# Patient Record
Sex: Male | Born: 1965 | Race: Black or African American | Hispanic: No | Marital: Single | Smoking: Current every day smoker
Health system: Southern US, Community
[De-identification: ages and names within clinical notes are randomized; demographics above are authoritative.]

## PROBLEM LIST (undated history)

## (undated) DIAGNOSIS — K7460 Unspecified cirrhosis of liver: Secondary | ICD-10-CM

## (undated) DIAGNOSIS — B182 Chronic viral hepatitis C: Secondary | ICD-10-CM

## (undated) DIAGNOSIS — F1020 Alcohol dependence, uncomplicated: Secondary | ICD-10-CM

## (undated) DIAGNOSIS — I10 Essential (primary) hypertension: Secondary | ICD-10-CM

## (undated) HISTORY — DX: Chronic viral hepatitis C: B18.2

## (undated) HISTORY — DX: Unspecified cirrhosis of liver: K74.60

## (undated) HISTORY — DX: Essential (primary) hypertension: I10

---

## 2008-05-06 ENCOUNTER — Emergency Department: Payer: Self-pay | Admitting: Emergency Medicine

## 2011-12-22 ENCOUNTER — Emergency Department: Payer: Self-pay | Admitting: Emergency Medicine

## 2013-03-24 ENCOUNTER — Encounter (HOSPITAL_COMMUNITY): Payer: Self-pay | Admitting: Emergency Medicine

## 2013-03-24 ENCOUNTER — Emergency Department (HOSPITAL_COMMUNITY): Payer: Self-pay

## 2013-03-24 ENCOUNTER — Emergency Department (HOSPITAL_COMMUNITY)
Admission: EM | Admit: 2013-03-24 | Discharge: 2013-03-24 | Disposition: A | Discharge status: Home / Self Care | Payer: Self-pay | Attending: Emergency Medicine | Admitting: Emergency Medicine

## 2013-03-24 DIAGNOSIS — J3489 Other specified disorders of nose and nasal sinuses: Secondary | ICD-10-CM | POA: Insufficient documentation

## 2013-03-24 DIAGNOSIS — R197 Diarrhea, unspecified: Secondary | ICD-10-CM | POA: Insufficient documentation

## 2013-03-24 DIAGNOSIS — J40 Bronchitis, not specified as acute or chronic: Secondary | ICD-10-CM

## 2013-03-24 DIAGNOSIS — R630 Anorexia: Secondary | ICD-10-CM | POA: Insufficient documentation

## 2013-03-24 DIAGNOSIS — B9789 Other viral agents as the cause of diseases classified elsewhere: Secondary | ICD-10-CM | POA: Insufficient documentation

## 2013-03-24 DIAGNOSIS — IMO0001 Reserved for inherently not codable concepts without codable children: Secondary | ICD-10-CM | POA: Insufficient documentation

## 2013-03-24 DIAGNOSIS — B349 Viral infection, unspecified: Secondary | ICD-10-CM

## 2013-03-24 DIAGNOSIS — F17200 Nicotine dependence, unspecified, uncomplicated: Secondary | ICD-10-CM | POA: Insufficient documentation

## 2013-03-24 DIAGNOSIS — J209 Acute bronchitis, unspecified: Secondary | ICD-10-CM | POA: Insufficient documentation

## 2013-03-24 MED ORDER — AZITHROMYCIN 250 MG PO TABS: 250.0000 mg | ORAL_TABLET | Freq: Every day | ORAL | Status: DC

## 2013-03-24 MED ORDER — ALBUTEROL SULFATE HFA 108 (90 BASE) MCG/ACT IN AERS
2.0000 | INHALATION_SPRAY | Freq: Once | RESPIRATORY_TRACT | Status: AC
  Administered 2013-03-24: 2 via RESPIRATORY_TRACT
  Filled 2013-03-24: qty 6.7

## 2013-03-24 MED ORDER — ACETAMINOPHEN 325 MG PO TABS
650.0000 mg | ORAL_TABLET | Freq: Once | ORAL | Status: AC
  Administered 2013-03-24: 650 mg via ORAL
  Filled 2013-03-24: qty 2

## 2013-03-24 NOTE — ED Notes (Signed)
Pt c/o "flu lilke symptoms" x 3 days.  C/o cough, chills, generalized body aches, and runny nose.  Pain score 8/10.  Pt sts his aunt has been sick too.

## 2013-03-24 NOTE — ED Provider Notes (Signed)
CSN: [id number]     Arrival date & time 03/24/13  1459 History   This chart was scribed for non-physician practitioner Raymon MuttonMarissa Jonthan Leite, PA-C, working with Toy BakerAnthony T Allen, MD, by Yevette EdwardsAngela Bracken, ED Scribe. This patient was seen in room WTR5/WTR5 and the patient's care was started at 4:19 PM. First MD Initiated Contact with Patient 03/24/13 1540     Chief Complaint  Patient presents with  . Flu like symptoms     The history is provided by the patient. No language interpreter was used.   HPI Comments: Danny Carlson is a 48 y.o. male who presents to the Emergency Department complaining of three days of myalgia which has been accompanied by a cough productive of white phlegm, posttussis chest tightness, chills, white-colored rhinorrhea, decreased appetite, a sore throat, and diarrhea . He rates the myalgia as 8/10. In the ED, his temperature is 99.2 F. He denies experiencing SOB, chest pain, neck pain, neck stiffness, otalgia, eye pain, abdominal pain, nausea, emesis, sinus pressure, blurred vision, dizziness, or a fever. He has treated his symptoms with Robitussin and Alka-Selzer Plus with temporary relief. The pt has been exposed to sick contacts with similar symptoms. He is a current pack-a- day smoker.   The pt does not have a PCP.   History reviewed. No pertinent past medical history. History reviewed. No pertinent past surgical history. History reviewed. No pertinent family history. History  Substance Use Topics  . Smoking status: Current Every Day Smoker -- 0.50 packs/day    Types: Cigarettes  . Smokeless tobacco: Never Used  . Alcohol Use: Yes    Review of Systems  Constitutional: Positive for chills. Negative for fever (99.2 F in the ED).  HENT: Positive for rhinorrhea and sore throat. Negative for ear pain and sinus pressure.   Eyes: Negative for pain and visual disturbance.  Respiratory: Positive for cough. Negative for shortness of breath.   Cardiovascular: Negative for  chest pain.  Gastrointestinal: Positive for diarrhea. Negative for nausea, vomiting and abdominal pain.  Musculoskeletal: Positive for myalgias. Negative for neck pain and neck stiffness.  Neurological: Negative for dizziness.  All other systems reviewed and are negative.    Allergies  Review of patient's allergies indicates no known allergies.  Home Medications   Current Outpatient Rx  Name  Route  Sig  Dispense  Refill  . Acetaminophen-Guaifenesin (THERAFLU FLU/CHEST CONGESTION) 1000-400 MG PACK   Oral   Take 1 Package by mouth 2 (two) times daily as needed (flu-like symptoms).         Marland Kitchen. azithromycin (ZITHROMAX) 250 MG tablet   Oral   Take 1 tablet (250 mg total) by mouth daily. Take first 2 tablets together, then 1 every day until finished.   6 tablet   0   . guaiFENesin (ROBITUSSIN) 100 MG/5ML SOLN   Oral   Take 15 mLs by mouth every 4 (four) hours as needed for cough or to loosen phlegm (cough).         . pseudoephedrine (SINUS & ALLERGY 12 HOUR) 120 MG 12 hr tablet   Oral   Take 120 mg by mouth every 12 (twelve) hours as needed for congestion (congestion).          Triage Vitals: BP 131/85  Pulse 85  Temp(Src) 99.2 F (37.3 C) (Oral)  Resp 20  SpO2 98%  Physical Exam  Nursing note and vitals reviewed. Constitutional: He is oriented to person, place, and time. He appears well-developed and well-nourished. No distress.  HENT:  Head: Normocephalic and atraumatic.  Right Ear: Tympanic membrane and external ear normal. Tympanic membrane is not injected, not erythematous, not retracted and not bulging. No middle ear effusion.  Left Ear: Tympanic membrane and external ear normal. Tympanic membrane is not injected, not erythematous, not retracted and not bulging.  No middle ear effusion.  Mouth/Throat: Oropharynx is clear and moist. No oropharyngeal exudate.  Negative swelling, erythema, inflammation, lesions, sores, exudate, petechiae noted to posterior  oropharynx and bilateral tonsils. Uvula midline, symmetrical elevation. Negative uvula swelling. Negative trismus.  Eyes: Conjunctivae and EOM are normal. Pupils are equal, round, and reactive to light. Right eye exhibits no discharge. Left eye exhibits no discharge.  Neck: Normal range of motion. Neck supple. No tracheal deviation present.  Neck stiffness Negative nuchal rigidity Negative cervical lymphadenopathy  Cardiovascular: Normal rate, regular rhythm and normal heart sounds.  Exam reveals no friction rub.   No murmur heard. Pulses:      Radial pulses are 2+ on the right side, and 2+ on the left side.  Cap refill less than 3 seconds  Pulmonary/Chest: Effort normal and breath sounds normal. No respiratory distress. He has no wheezes. He has no rales. He exhibits no tenderness.  Negative respiratory distress Patient able to speak in full sentences without difficulty Negative rhonchi or wheezes identified upon auscultation  Musculoskeletal: Normal range of motion.  Lymphadenopathy:    He has no cervical adenopathy.  Neurological: He is alert and oriented to person, place, and time. No cranial nerve deficit. He exhibits normal muscle tone. Coordination normal.  Skin: Skin is warm and dry.  Psychiatric: He has a normal mood and affect. His behavior is normal.     ED Course  Procedures (including critical care time)  DIAGNOSTIC STUDIES: Oxygen Saturation is 98% on room air, normal by my interpretation.    COORDINATION OF CARE:  4:24 PM- Discussed treatment plan with patient, which includes a chest x-ray and EKG, and the patient agreed to the plan.   Labs Review Labs Reviewed  INFLUENZA PANEL BY PCR (TYPE A & B, H1N1)   Imaging Review Dg Chest 2 View  03/24/2013   CLINICAL DATA:  Flu-like symptoms.  Cough and chest congestion.  EXAM: CHEST  2 VIEW  COMPARISON:  None.  FINDINGS: The heart size is normal. The lungs are clear. Degenerative or posttraumatic changes are noted in  the left shoulder at the [facility] joint and coracoclavicular ligament. The visualized soft tissues and bony thorax are otherwise unremarkable.  IMPRESSION: 1. No acute cardiopulmonary disease. 2. Degenerative or likely posttraumatic changes at the left shoulder.   Electronically Signed   By: Gennette Pac M.D.   On: 03/24/2013 15:53    EKG Interpretation    Date/Time:  Thursday March 24 2013 16:32:26 EST Ventricular Rate:  83 PR Interval:  149 QRS Duration: 72 QT Interval:  372 QTC Calculation: 437 R Axis:   45 Text Interpretation:  Sinus rhythm Right atrial enlargement ST elev, probable normal early repol pattern Baseline wander in lead(s) V3 V6 Confirmed by ALLEN  MD, ANTHONY ([id number]) on 03/24/2013 4:36:08 PM            MDM   1. Viral illness   2. Bronchitis    Medications  acetaminophen (TYLENOL) tablet 650 mg (not administered)   Filed Vitals:   03/24/13 1509  BP: 131/85  Pulse: 85  Temp: 99.2 F (37.3 C)  TempSrc: Oral  Resp: 20  SpO2: 98%   I personally  performed the services described in this documentation, which was scribed in my presence. The recorded information has been reviewed and is accurate.  Patient presenting to emergency department with bodyaches that his generalized, cough that is productive, sore throat, nasal congestion, chest tightness associated with cough is been ongoing for the past 3 days. Patient reports that his aunt has the same symptoms. Reports he's been using Alka-Seltzer, Theraflu, Robitussin with minimal relief. Patient reports she smokes cigarettes proximal and one pack per day. Alert oriented. GCS 15. Heart rate and rhythm normal. Lungs clear to auscultation-mildly decreased breath sounds. Good lung expansion. Negative signs of respiratory distress-patient stable to speak in full sentences without difficulty. Negative use of excess her muscles. Radial pulses 2+ bilaterally. Nasal congestion identified. Negative swelling, erythema, exudate,  petechiae noted to bilateral tonsils and posterior oropharynx. Uvula midline, symmetrical elevation. Negative uvula swelling. Full range of motion to upper and lower extremities identified without difficulty. Cap refill less than 3 seconds. Chest x-ray negative for acute cardiopulmonary disease-negative findings for acute reactive airway disease. EKG negative findings for acute ischemic findings. Influenza panel pending. Patient stable, afebrile. Doubt pneumonia. Suspicion to be bronchitis-viral illness in nature. Patient given albuterol inhaler and discharged with azithromycin. Referred patient to urgent care Center and health and wellness Center to be reevaluated. Discussed with patient to rest and stay hydrated. Patient to refrain from cigarette smoking. Discussed with patient to closely monitor symptoms and if symptoms are to worsen or change to report back to the ED - strict return instructions given.  Patient agreed to plan of care, understood, all questions answered.   Raymon Mutton, PA-C 03/25/13 1907

## 2013-03-24 NOTE — Discharge Instructions (Signed)
Please call and set an appointment with urgent care Center to be reassessed regarding upper respiratory infection, bronchitis Please use albuterol inhaler as needed for shortness of breath, to help breathing Please refrain from any cigarette smoking for this can worsen symptoms in nature and can lead to worse infection Please continue to monitor for fever-please continue to use Tylenol as needed for fever control-please no more than 4000 milligram/4 gram per day for this can lead to liver failure and Tylenol overdose Please take antibiotics as prescribed-please take on a full stomach  Please continue to monitor symptoms closely and if symptoms are to worsen or change (fever greater than 101, chills, neck pain, neck stiffness, shortness of breath, difficulty breathing, chest pain, weakness, numbness, tingling, worsening cough, sore throat, inability to swallow) please report back to emergency department immediately  Bronchitis Bronchitis is inflammation of the airways that extend from the windpipe into the lungs (bronchi). The inflammation often causes mucus to develop, which leads to a cough. If the inflammation becomes severe, it may cause shortness of breath. CAUSES  Bronchitis may be caused by:   Viral infections.   Bacteria.   Cigarette smoke.   Allergens, pollutants, and other irritants.  SIGNS AND SYMPTOMS  The most common symptom of bronchitis is a frequent cough that produces mucus. Other symptoms include:  Fever.   Body aches.   Chest congestion.   Chills.   Shortness of breath.   Sore throat.  DIAGNOSIS  Bronchitis is usually diagnosed through a medical history and physical exam. Tests, such as chest X-rays, are sometimes done to rule out other conditions.  TREATMENT  You may need to avoid contact with whatever caused the problem (smoking, for example). Medicines are sometimes needed. These may include:  Antibiotics. These may be prescribed if the condition  is caused by bacteria.  Cough suppressants. These may be prescribed for relief of cough symptoms.   Inhaled medicines. These may be prescribed to help open your airways and make it easier for you to breathe.   Steroid medicines. These may be prescribed for those with recurrent (chronic) bronchitis. HOME CARE INSTRUCTIONS  Get plenty of rest.   Drink enough fluids to keep your urine clear or pale yellow (unless you have a medical condition that requires fluid restriction). Increasing fluids may help thin your secretions and will prevent dehydration.   Only take over-the-counter or prescription medicines as directed by your health care provider.  Only take antibiotics as directed. Make sure you finish them even if you start to feel better.  Avoid secondhand smoke, irritating chemicals, and strong fumes. These will make bronchitis worse. If you are a smoker, quit smoking. Consider using nicotine gum or skin patches to help control withdrawal symptoms. Quitting smoking will help your lungs heal faster.   Put a cool-mist humidifier in your bedroom at night to moisten the air. This may help loosen mucus. Change the water in the humidifier daily. You can also run the hot water in your shower and sit in the bathroom with the door closed for 5 10 minutes.   Follow up with your health care provider as directed.   Wash your hands frequently to avoid catching bronchitis again or spreading an infection to others.  SEEK MEDICAL CARE IF: Your symptoms do not improve after 1 week of treatment.  SEEK IMMEDIATE MEDICAL CARE IF:  Your fever increases.  You have chills.   You have chest pain.   You have worsening shortness of breath.  You have bloody sputum.  You faint.  You have lightheadedness.  You have a severe headache.   You vomit repeatedly. MAKE SURE YOU:   Understand these instructions.  Will watch your condition.  Will get help right away if you are not doing  well or get worse. Document Released: 02/24/2005 Document Revised: 12/15/2012 Document Reviewed: 10/19/2012 [facility] Patient Information 2014 [place], [place].   Emergency Department Resource Guide 1) Find a Doctor and Pay Out of Pocket Although you won't have to find out who is covered by your insurance plan, it is a good idea to ask around and get recommendations. You will then need to call the office and see if the doctor you have chosen will accept you as a new patient and what types of options they offer for patients who are self-pay. Some doctors offer discounts or will set up payment plans for their patients who do not have insurance, but you will need to ask so you aren't surprised when you get to your appointment.  2) Contact Your Local Health Department Not all health departments have doctors that can see patients for sick visits, but many do, so it is worth a call to see if yours does. If you don't know where your local health department is, you can check in your phone book. The CDC also has a tool to help you locate your state's health department, and many state websites also have listings of all of their local health departments.  3) Find a Walk-in Clinic If your illness is not likely to be very severe or complicated, you may want to try a walk in clinic. These are popping up all over the country in pharmacies, drugstores, and shopping centers. They're usually staffed by nurse practitioners or physician assistants that have been trained to treat common illnesses and complaints. They're usually fairly quick and inexpensive. However, if you have serious medical issues or chronic medical problems, these are probably not your best option.  No Primary Care Doctor: - Call Health Connect at  [phone] - they can help you locate a primary care doctor that  accepts your insurance, provides certain services, etc. - Physician Referral Service- [phone]  Chronic Pain  Problems: Organization         Address  Phone   Notes  [facility]  [phone] Patients need to be referred by their primary care doctor.   Medication Assistance: Organization         Address  Phone   Notes  [facility] Medication [facility] [address] [place] [place], [place] [address] [phone] --Must be a resident of [facility] -- Must have NO insurance coverage whatsoever (no Medicaid/ Medicare, etc.) -- The pt. MUST have a primary care doctor that directs their care regularly and follows them in the community   [facility]  [phone]   [employer]  [phone]    Agencies that provide inexpensive medical care: Organization         Address  Phone   Notes  [facility]  [phone]   [facility]    [phone]   [facility] [address] [place], [place] [address] [phone]   [facility] [address], [place] [phone]   Planned Parenthood    [phone]   [facility]    [phone]   [facility]  [address], [place] Phone:  [phone], Fax:  [phone] Hours of Operation:  9 am - 6 pm, M-F.  Also accepts Medicaid/Medicare and self-pay.  [facility]  [address], [place] Phone: [phone], Fax: [phone]. Hours of Operation:  8:30 am - 5:30 pm, M-F.  Also accepts Medicaid and self-pay.  [facility] [place] [address], [place] Phone: [phone]   [facility] [address] Natasha Bence [place], [place] [phone], Ext. [phone] Mondays & Thursdays: 7-9 AM.  First 15 patients are seen on a first come, first serve basis.    Medicaid-accepting [facility] Providers:  Organization         Address  Phone   Notes  [facility] [address], [place] [phone] Also  accepts self-pay patients.  [facility] [address] Laurell Josephs [place], [place]  [phone]   [facility] [address], [place] [phone]   [facility] [address] [place] [phone]   Renaye Rakers [address], [place]   [phone] Only accepts [place] patients after they have their name applied to their card.   Self-Pay (no insurance) in [facility]:  Organization         Address  Phone   Notes  Sickle Cell Patients, [facility] [address] [place], [place] [phone]   [facility] [address] [place], [place] [phone]   [facility] [place]  [address], [place] [phone]   [facility]/Dr. Osei-Bonsu  [address] [place] or [address], [place] [phone] Phone number for both [place] and [place] locations is the same.  [facility]gent Medical and [facility] [address] [place] [phone]   [facility] [address], [place] or [address] [phone]   [facility] [address] [place] [phone], phone; [phone], fax Sees patients 1st and 3rd Saturday of every month.  Must not qualify for public or private insurance (i.e. Medicaid, Medicare, [place] Health Choice, Veterans' Benefits)  Household income should be no more than 200% of the poverty level The clinic cannot treat you if you are pregnant or think you are pregnant  Sexually transmitted diseases are not treated at the clinic.    Dental Care: Organization         Address  Phone  Notes  [facility] Department of [facility] [address] [place], [place] [phone] Accepts children up to age 32 who are enrolled in [place] or [place] Health Choice; pregnant  women with a Medicaid card; and children who have applied for Medicaid or [place] Health Choice, but were declined, whose parents can pay a reduced fee at time of service.  [facility] Department of [facility]  [address], [place] [phone] Accepts children up to age 29 who are enrolled in [place] or [place] Health Choice; pregnant women with a Medicaid card; and children who have applied for Medicaid or [place] Health Choice, but were declined, whose parents can pay a reduced fee at time of service.  [facility] Adult Dental Access PROGRAM  [address] [place], [place] [phone] Patients are  seen by appointment only. Walk-ins are not accepted. [facility] will see patients 59 years of age and older. Monday - Tuesday (8am-5pm) Most Wednesdays (8:30-5pm) $30 per visit, cash only  [facility] Adult Dental Access PROGRAM  [address], [facility] [phone] Patients are seen by appointment only. Walk-ins are not accepted. [facility] will see patients 62 years of age and older. One Wednesday Evening (Monthly: Volunteer Based).  $30 per visit, cash only  [employer]  [phone] for adults; Children under age 64, call Graduate Pediatric Dentistry at [phone]. Children aged 28-14, please call [phone] to request a pediatric application.  Dental services are provided in all areas of dental care including fillings, crowns and bridges, complete and partial dentures, implants, gum treatment, root canals, and extractions. Preventive care is also provided. Treatment is provided to both adults and children. Patients are selected via a lottery and there is often a waiting list.   [facility] [address], [place]  [phone] [email or web address]   [facility] [address] [place], [place] [phone], Ext. [phone] Second and Fourth Thursday of each month, opens at 6:30 AM; Clinic ends at 9 AM.  Patients are  seen on a first-come first-served basis, and a limited number are seen during each clinic.   [facility]  [address] Ether Griffins [place], [place] [phone]   Eligibility Requirements You must have lived in [place], [place], or [place] counties for at least the last three months.   You cannot be eligible for state or federal sponsored [place], including [employer], [place], or [employer].   You generally cannot be eligible for healthcare insurance through your employer.    How to apply: Eligibility screenings are held every Tuesday and Wednesday afternoon from 1:00 pm until 4:00 pm. You do not need an appointment for the interview!  [facility] [address], [place], [place] [phone]   [facility] Department  [phone]   [facility] Health Department  [phone]   [facility] Health Department  [phone]    Behavioral Health Resources in the Community: Intensive Outpatient Programs Organization         Address  Phone  Notes  [facility] [address] [place], [place] [phone]   [facility] Outpatient [address] [place], [place] [phone]   [facility] [address], [place], [place]  [phone]   [facility] [address],  [place], [place] [phone] or [phone]   Substance Abuse Resources Organization         Address  Phone  Notes  [facility] Drug [facility]  [phone]   [facility]  [phone]   [facility] [place]  [phone]   Floydene Flock  [phone]   Residential & Outpatient Substance Abuse Program  [phone]   Psychological Services Organization         Address  Phone  Notes  [facility]  [phone]   [facility]  [phone]   [facility] [address] [place] [phone] or [phone]    Mobile Crisis  Teams Organization         Address  Phone  Notes  [facility], Mobile Crisis Care Unit  [phone]   [facility]  [address] [place], [place] [phone]   [facility] [address] [place] [phone]    Self-Help/Support Groups Organization         Address  Phone             Notes  [facility] [place] - variety of support groups  336- [phone] Call for more information  Narcotics Anonymous (NA), [facility] [address], [place]  2 meetings at this location   Statisticianesidential Treatment Programs Organization         Address  Phone  Notes  [facility] [address],    [place]  [phone]   [facility]  [address], [place] [phone], [place], [place] [phone]   [facility] [address], [place] [phone] Admissions: 8am-3pm M-F  [facility] [address]    [place], [place] [phone]   [facility] [address], [place], [place] [phone]   [facility] [address]  [place], [place] [phone]   [facility] - Intensive Outpatient [address], [place], [place] [phone]   [facility] ([facility] [address]  [place], [place] [phone] or [phone]   [facility] ([facility]) [address] [place], [place] [phone] Accepts Medicaid  [facility] [address]  [place] [phone] Substance Abuse/Addiction Treatment   [facility] Behavioral Health Resources Organization         Address  Phone  Notes  [facility]  [phone]) [phone]   Angie FavaJulie Brannon, PhD [address] [place], [place]   [phone](336) [phone] or [phone]) [phone]   [facility]   [address] [place], [place] [phone]([phone]   [facility] [address], [place], [place] [phone](336) [phone]  Insurance/Medicaid/sponsorship through [facility]  [facility] [address]                                    [place], [place] [phone]) [phone] Therapy/tele-psych/case  [facility] [address]   [place], [place] [phone](336) [phone]    Dr. Lolly MustacheArfeen  [phone](336) [phone]   [facility] [place]  [employer] [facility] 1) [address], [place] 2) [address], [place] 3)  [address], [place] [phone](336) [phone]  [phone](336) [phone]   [facility] Child Abuse Hotline [phone](336) [phone] or [phone](336) [phone] (After Hours)

## 2013-03-25 LAB — INFLUENZA PANEL BY PCR (TYPE A & B)
H1N1 flu by pcr: NOT DETECTED
INFLAPCR: POSITIVE — AB
INFLBPCR: NEGATIVE

## 2013-03-28 NOTE — ED Provider Notes (Signed)
Medical screening examination/treatment/procedure(s) were performed by non-physician practitioner and as supervising physician I was immediately available for consultation/collaboration.   Amadea Keagy T Laurisa Sahakian, MD 03/28/13 0720 

## 2015-01-10 ENCOUNTER — Encounter (HOSPITAL_COMMUNITY): Payer: Self-pay | Admitting: *Deleted

## 2015-01-10 ENCOUNTER — Emergency Department (HOSPITAL_COMMUNITY): Payer: Self-pay

## 2015-01-10 ENCOUNTER — Emergency Department (HOSPITAL_COMMUNITY)
Admission: EM | Admit: 2015-01-10 | Discharge: 2015-01-10 | Disposition: A | Discharge status: Home / Self Care | Payer: Self-pay | Attending: Emergency Medicine | Admitting: Emergency Medicine

## 2015-01-10 DIAGNOSIS — Y9289 Other specified places as the place of occurrence of the external cause: Secondary | ICD-10-CM | POA: Insufficient documentation

## 2015-01-10 DIAGNOSIS — Y9389 Activity, other specified: Secondary | ICD-10-CM | POA: Insufficient documentation

## 2015-01-10 DIAGNOSIS — Y998 Other external cause status: Secondary | ICD-10-CM | POA: Insufficient documentation

## 2015-01-10 DIAGNOSIS — W08XXXA Fall from other furniture, initial encounter: Secondary | ICD-10-CM | POA: Insufficient documentation

## 2015-01-10 DIAGNOSIS — S0990XA Unspecified injury of head, initial encounter: Secondary | ICD-10-CM

## 2015-01-10 DIAGNOSIS — S01511A Laceration without foreign body of lip, initial encounter: Secondary | ICD-10-CM | POA: Insufficient documentation

## 2015-01-10 DIAGNOSIS — T1490XA Injury, unspecified, initial encounter: Secondary | ICD-10-CM

## 2015-01-10 DIAGNOSIS — R040 Epistaxis: Secondary | ICD-10-CM | POA: Insufficient documentation

## 2015-01-10 DIAGNOSIS — F10129 Alcohol abuse with intoxication, unspecified: Secondary | ICD-10-CM | POA: Insufficient documentation

## 2015-01-10 DIAGNOSIS — Z720 Tobacco use: Secondary | ICD-10-CM | POA: Insufficient documentation

## 2015-01-10 NOTE — ED Provider Notes (Signed)
CSN: [id number]     Arrival date & time 01/10/15  1520 History   First MD Initiated Contact with Patient 01/10/15 1749     Chief Complaint  Patient presents with  . Alcohol Intoxication  . Fall     (Consider location/radiation/quality/duration/timing/severity/associated sxs/prior Treatment) HPI Comments: 49 y.o. Male with history of previous head injury, alcohol abuse presents with his cousin for fall.  The patient reportedly has been drinking alcohol since last night and has had multiple 40 ounce beers today.  Last night he was with family and had a witnessed fall off of a couch and reportedly today the patient had another fall but he is not able to tell us what happened.  Apparently the people he was with were also drinking and could not tell much of a history but called EMS because he got hurt and had a bloody nose.  Patient is a 49 y.o. male presenting with intoxication and fall.  Alcohol Intoxication Pertinent negatives include no chest pain, no abdominal pain and no shortness of breath.  Fall Pertinent negatives include no chest pain, no abdominal pain and no shortness of breath.    History reviewed. No pertinent past medical history. History reviewed. No pertinent past surgical history. No family history on file. Social History  Substance Use Topics  . Smoking status: Current Every Day Smoker -- 0.50 packs/day    Types: Cigarettes  . Smokeless tobacco: Never Used  . Alcohol Use: Yes    Review of Systems  HENT: Positive for nosebleeds. Negative for dental problem, ear discharge, ear pain and sore throat.   Eyes: Negative for pain and visual disturbance.  Respiratory: Negative for chest tightness and shortness of breath.   Cardiovascular: Negative for chest pain.  Gastrointestinal: Negative for nausea, vomiting and abdominal pain.  Genitourinary: Negative for flank pain.  Musculoskeletal: Negative for myalgias, back pain, joint swelling and neck pain.  Skin: Positive for  wound (to bottom lip reportedly from yesterday).  Neurological: Negative for syncope.  Hematological: Does not bruise/bleed easily.      Allergies  Review of patient's allergies indicates no known allergies.  Home Medications   Prior to Admission medications   Medication Sig Start Date End Date Taking? Authorizing Provider  pseudoephedrine (SINUS & ALLERGY 12 HOUR) 120 MG 12 hr tablet Take 120 mg by mouth every 12 (twelve) hours as needed for congestion (congestion).   Yes Historical Provider, MD  azithromycin (ZITHROMAX) 250 MG tablet Take 1 tablet (250 mg total) by mouth daily. Take first 2 tablets together, then 1 every day until finished. Patient not taking: Reported on 01/10/2015 03/24/13   Marissa Sciacca, PA-C   BP 138/94 mmHg  Pulse 89  Temp(Src) 98.8 F (37.1 C) (Oral)  Resp 16  Ht 5\' 4"  (1.626 m)  Wt 120 lb (54.432 kg)  BMI 20.59 kg/m2  SpO2 97% Physical Exam  Constitutional: He is oriented to person, place, and time. He appears well-developed and well-nourished. No distress.  intoxicated  HENT:  Head: Normocephalic and atraumatic.  Right Ear: External ear normal. No hemotympanum.  Left Ear: External ear normal. No hemotympanum.  Nose: No nasal septal hematoma. Epistaxis is observed.  Mouth/Throat: Oropharynx is clear and moist. Lacerations present. No oropharyngeal exudate.    Eyes: EOM are normal. Pupils are equal, round, and reactive to light.  Neck: Normal range of motion. Neck supple.  Cardiovascular: Normal rate, regular rhythm, normal heart sounds and intact distal pulses.   No murmur heard. Pulmonary/Chest: Effort normal.  No respiratory distress. He has no wheezes. He has no rales.  Abdominal: Soft. He exhibits no distension. There is no tenderness.  Musculoskeletal: Normal range of motion. He exhibits no edema or tenderness.  Neurological: He is alert and oriented to person, place, and time.  Skin: Skin is warm and dry. No rash noted. He is not  diaphoretic.  Vitals reviewed.   ED Course  Procedures (including critical care time) Labs Review Labs Reviewed - No data to display  Imaging Review Dg Lumbar Spine Complete  01/10/2015  CLINICAL DATA:  Larey Seat off couch onto floor this afternoon. Low back injury and pain. Initial encounter. EXAM: LUMBAR SPINE - COMPLETE 4+ VIEW COMPARISON:  None. FINDINGS: There is no evidence of lumbar spine fracture. Alignment is normal. Intervertebral disc spaces are maintained. No evidence of facet arthropathy or other significant bone abnormality. IMPRESSION: Negative lumbar spine radiographs. Electronically Signed   By: Myles Rosenthal M.D.   On: 01/10/2015 19:20   Ct Head Wo Contrast  01/10/2015  CLINICAL DATA:  Injury EXAM: CT HEAD WITHOUT CONTRAST CT CERVICAL SPINE WITHOUT CONTRAST TECHNIQUE: Multidetector CT imaging of the head and cervical spine was performed following the standard protocol without intravenous contrast. Multiplanar CT image reconstructions of the cervical spine were also generated. COMPARISON:  05/06/2008 FINDINGS: CT HEAD FINDINGS There is no mass effect, midline shift, or acute intracranial hemorrhage. Brain parenchyma and ventricular system are within normal limits. Mastoid air cells and visualized paranasal sinuses are clear. The cranium is intact. There is soft tissue swelling over the right parietal bone without evidence of underlying fracture. CT CERVICAL SPINE FINDINGS There is no evidence of fracture or dislocation. There is anatomic alignment through the cervical spine. There is no obvious spinal hematoma. There is no obvious soft tissue injury. Left sided posterior-lateral osteophytes at C5-C6 encroach upon the anterior left spinal canal causing some degree of stenosis. Anterior osteophytes are seen throughout the cervical spine. Thyroid gland is unremarkable. IMPRESSION: No evidence of acute intracranial pathology. Soft tissue swelling over the right parietal bone is noted. There is no  underlying fracture. No evidence of acute cervical spine injury. Degenerative changes are noted, most pronounced at C5-C6. Electronically Signed   By: Jolaine Click M.D.   On: 01/10/2015 19:34   Ct Cervical Spine Wo Contrast  01/10/2015  CLINICAL DATA:  Injury EXAM: CT HEAD WITHOUT CONTRAST CT CERVICAL SPINE WITHOUT CONTRAST TECHNIQUE: Multidetector CT imaging of the head and cervical spine was performed following the standard protocol without intravenous contrast. Multiplanar CT image reconstructions of the cervical spine were also generated. COMPARISON:  05/06/2008 FINDINGS: CT HEAD FINDINGS There is no mass effect, midline shift, or acute intracranial hemorrhage. Brain parenchyma and ventricular system are within normal limits. Mastoid air cells and visualized paranasal sinuses are clear. The cranium is intact. There is soft tissue swelling over the right parietal bone without evidence of underlying fracture. CT CERVICAL SPINE FINDINGS There is no evidence of fracture or dislocation. There is anatomic alignment through the cervical spine. There is no obvious spinal hematoma. There is no obvious soft tissue injury. Left sided posterior-lateral osteophytes at C5-C6 encroach upon the anterior left spinal canal causing some degree of stenosis. Anterior osteophytes are seen throughout the cervical spine. Thyroid gland is unremarkable. IMPRESSION: No evidence of acute intracranial pathology. Soft tissue swelling over the right parietal bone is noted. There is no underlying fracture. No evidence of acute cervical spine injury. Degenerative changes are noted, most pronounced at C5-C6. Electronically Signed  By: Jolaine Click M.D.   On: 01/10/2015 19:34   I have personally reviewed and evaluated these images and lab results as part of my medical decision-making.   EKG Interpretation None      MDM  Patient seen and evaluated in stable condition.  Imaging negative for acute process.  Patient stable although  intoxicated and has family who is willing and able to take patient home.  Patient discharged home in stable condition. Final diagnoses:  Trauma  Head injury, initial encounter    1. Head injury  2. Alcohol intoxication    Leta Baptist, MD 01/11/15 [phone]

## 2015-01-10 NOTE — ED Notes (Signed)
Seleta Rhymesracy Morrow, pt.s cousin.  [phone],  She will pick him up or contact her for information /updates

## 2015-01-10 NOTE — Discharge Instructions (Signed)
Head Injury, Adult °You have received a head injury. It does not appear serious at this time. Headaches and vomiting are common following head injury. It should be easy to awaken from sleeping. Sometimes it is necessary for you to stay in the emergency department for a while for observation. Sometimes admission to the hospital may be needed. After injuries such as yours, most problems occur within the first 24 hours, but side effects may occur up to 7-10 days after the injury. It is important for you to carefully monitor your condition and contact your health care provider or seek immediate medical care if there is a change in your condition. °WHAT ARE THE TYPES OF HEAD INJURIES? °Head injuries can be as minor as a bump. Some head injuries can be more severe. More severe head injuries include: °· A jarring injury to the brain (concussion). °· A bruise of the brain (contusion). This mean there is bleeding in the brain that can cause swelling. °· A cracked skull (skull fracture). °· Bleeding in the brain that collects, clots, and forms a bump (hematoma). °WHAT CAUSES A HEAD INJURY? °A serious head injury is most likely to happen to someone who is in a car wreck and is not wearing a seat belt. Other causes of major head injuries include bicycle or motorcycle accidents, sports injuries, and falls. °HOW ARE HEAD INJURIES DIAGNOSED? °A complete history of the event leading to the injury and your current symptoms will be helpful in diagnosing head injuries. Many times, pictures of the brain, such as CT or MRI are needed to see the extent of the injury. Often, an overnight hospital stay is necessary for observation.  °WHEN SHOULD I SEEK IMMEDIATE MEDICAL CARE?  °You should get help right away if: °· You have confusion or drowsiness. °· You feel sick to your stomach (nauseous) or have continued, forceful vomiting. °· You have dizziness or unsteadiness that is getting worse. °· You have severe, continued headaches not  relieved by medicine. Only take over-the-counter or prescription medicines for pain, fever, or discomfort as directed by your health care provider. °· You do not have normal function of the arms or legs or are unable to walk. °· You notice changes in the black spots in the center of the colored part of your eye (pupil). °· You have a clear or bloody fluid coming from your nose or ears. °· You have a loss of vision. °During the next 24 hours after the injury, you must stay with someone who can watch you for the warning signs. This person should contact local emergency services (911 in the [place] if you have seizures, you become unconscious, or you are unable to wake up. °HOW CAN I PREVENT A HEAD INJURY IN THE FUTURE? °The most important factor for preventing major head injuries is avoiding motor vehicle accidents.  To minimize the potential for damage to your head, it is crucial to wear seat belts while riding in motor vehicles. Wearing helmets while bike riding and playing collision sports (like football) is also helpful. Also, avoiding dangerous activities around the house will further help reduce your risk of head injury.  °WHEN CAN I RETURN TO NORMAL ACTIVITIES AND ATHLETICS? °You should be reevaluated by your health care provider before returning to these activities. If you have any of the following symptoms, you should not return to activities or contact sports until 1 week after the symptoms have stopped: °· Persistent headache. °· Dizziness or vertigo. °· Poor attention and concentration. °· Confusion. °·   Memory problems.  Nausea or vomiting.  Fatigue or tire easily.  Irritability.  Intolerant of bright lights or loud noises.  Anxiety or depression.  Disturbed sleep. MAKE SURE YOU:   Understand these instructions.  Will watch your condition.  Will get help right away if you are not doing well or get worse.   This information is not intended to replace advice given to you by your health  care provider. Make sure you discuss any questions you have with your health care provider.   Document Released: 02/24/2005 Document Revised: 03/17/2014 Document Reviewed: 11/01/2012 Elsevier Interactive Patient Education 2016 [employer].  Alcohol Intoxication Alcohol intoxication occurs when the amount of alcohol that a person has consumed impairs his or her ability to mentally and physically function. Alcohol directly impairs the normal chemical activity of the brain. Drinking large amounts of alcohol can lead to changes in mental function and behavior, and it can cause many physical effects that can be harmful.  Alcohol intoxication can range in severity from mild to very severe. Various factors can affect the level of intoxication that occurs, such as the person's age, gender, weight, frequency of alcohol consumption, and the presence of other medical conditions (such as diabetes, seizures, or heart conditions). Dangerous levels of alcohol intoxication may occur when people drink large amounts of alcohol in a short period (binge drinking). Alcohol can also be especially dangerous when combined with certain prescription medicines or "recreational" drugs. SIGNS AND SYMPTOMS Some common signs and symptoms of mild alcohol intoxication include:  Loss of coordination.  Changes in mood and behavior.  Impaired judgment.  Slurred speech. As alcohol intoxication progresses to more severe levels, other signs and symptoms will appear. These may include:  Vomiting.  Confusion and impaired memory.  Slowed breathing.  Seizures.  Loss of consciousness. DIAGNOSIS  Your health care provider will take a medical history and perform a physical exam. You will be asked about the amount and type of alcohol you have consumed. Blood tests will be done to measure the concentration of alcohol in your blood. In many places, your blood alcohol level must be lower than 80 mg/dL (4.09%) to legally drive.  However, many dangerous effects of alcohol can occur at much lower levels.  TREATMENT  People with alcohol intoxication often do not require treatment. Most of the effects of alcohol intoxication are temporary, and they go away as the alcohol naturally leaves the body. Your health care provider will monitor your condition until you are stable enough to go home. Fluids are sometimes given through an IV access tube to help prevent dehydration.  HOME CARE INSTRUCTIONS  Do not drive after drinking alcohol.  Stay hydrated. Drink enough water and fluids to keep your urine clear or pale yellow. Avoid caffeine.   Only take over-the-counter or prescription medicines as directed by your health care provider.  SEEK MEDICAL CARE IF:   You have persistent vomiting.   You do not feel better after a few days.  You have frequent alcohol intoxication. Your health care provider can help determine if you should see a substance use treatment counselor. SEEK IMMEDIATE MEDICAL CARE IF:   You become shaky or tremble when you try to stop drinking.   You shake uncontrollably (seizure).   You throw up (vomit) blood. This may be bright red or may look like black coffee grounds.   You have blood in your stool. This may be bright red or may appear as a black, tarry, bad smelling stool.  You become lightheaded or faint.  MAKE SURE YOU:   Understand these instructions.  Will watch your condition.  Will get help right away if you are not doing well or get worse.   This information is not intended to replace advice given to you by your health care provider. Make sure you discuss any questions you have with your health care provider.   Document Released: 12/04/2004 Document Revised: 10/27/2012 Document Reviewed: 07/30/2012 Elsevier Interactive Patient Education [employer]2016 Elsevier Inc.

## 2015-01-10 NOTE — ED Notes (Signed)
Spoke with pt. Her stated, "I fell last night."  Pt. Denies any pain or discomfort.
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
## 2016-04-24 ENCOUNTER — Encounter (HOSPITAL_COMMUNITY): Payer: Self-pay

## 2016-04-24 ENCOUNTER — Emergency Department (HOSPITAL_COMMUNITY)
Admission: EM | Admit: 2016-04-24 | Discharge: 2016-04-24 | Disposition: A | Discharge status: Home / Self Care | Payer: Self-pay | Attending: Physician Assistant | Admitting: Physician Assistant

## 2016-04-24 DIAGNOSIS — R319 Hematuria, unspecified: Secondary | ICD-10-CM

## 2016-04-24 DIAGNOSIS — F17210 Nicotine dependence, cigarettes, uncomplicated: Secondary | ICD-10-CM | POA: Insufficient documentation

## 2016-04-24 DIAGNOSIS — Z79899 Other long term (current) drug therapy: Secondary | ICD-10-CM | POA: Insufficient documentation

## 2016-04-24 HISTORY — DX: Alcohol dependence, uncomplicated: F10.20

## 2016-04-24 LAB — ETHANOL: Alcohol, Ethyl (B): 373 mg/dL (ref <5)

## 2016-04-24 LAB — CBC
HCT: 37.9 % — ABNORMAL LOW (ref 39.0–52.0)
Hemoglobin: 13.6 g/dL (ref 13.0–17.0)
MCH: 35.2 pg — ABNORMAL HIGH (ref 26.0–34.0)
MCHC: 35.9 g/dL (ref 30.0–36.0)
MCV: 98.2 fL (ref 78.0–100.0)
Platelets: 89 10*3/uL — ABNORMAL LOW (ref 150–400)
RBC: 3.86 MIL/uL — ABNORMAL LOW (ref 4.22–5.81)
RDW: 11.9 % (ref 11.5–15.5)
WBC: 5.4 10*3/uL (ref 4.0–10.5)

## 2016-04-24 LAB — COMPREHENSIVE METABOLIC PANEL
ALK PHOS: 140 U/L — AB (ref 38–126)
ALT: 98 U/L — ABNORMAL HIGH (ref 17–63)
AST: 155 U/L — ABNORMAL HIGH (ref 15–41)
Albumin: 3.7 g/dL (ref 3.5–5.0)
Anion gap: 9 (ref 5–15)
BUN: 7 mg/dL (ref 6–20)
CALCIUM: 9 mg/dL (ref 8.9–10.3)
CO2: 23 mmol/L (ref 22–32)
Chloride: 102 mmol/L (ref 101–111)
Creatinine, Ser: 0.69 mg/dL (ref 0.61–1.24)
GLUCOSE: 115 mg/dL — AB (ref 65–99)
Potassium: 4.3 mmol/L (ref 3.5–5.1)
Sodium: 134 mmol/L — ABNORMAL LOW (ref 135–145)
Total Bilirubin: 1.9 mg/dL — ABNORMAL HIGH (ref 0.3–1.2)
Total Protein: 8.7 g/dL — ABNORMAL HIGH (ref 6.5–8.1)

## 2016-04-24 LAB — URINALYSIS, ROUTINE W REFLEX MICROSCOPIC
BILIRUBIN URINE: NEGATIVE
GLUCOSE, UA: NEGATIVE mg/dL
Ketones, ur: NEGATIVE mg/dL
LEUKOCYTES UA: NEGATIVE
Nitrite: NEGATIVE
PH: 5 (ref 5.0–8.0)
Protein, ur: NEGATIVE mg/dL
Specific Gravity, Urine: 1.005 (ref 1.005–1.030)

## 2016-04-24 LAB — LIPASE, BLOOD: Lipase: 81 U/L — ABNORMAL HIGH (ref 11–51)

## 2016-04-24 LAB — RAPID URINE DRUG SCREEN, HOSP PERFORMED
AMPHETAMINES: NOT DETECTED
Barbiturates: NOT DETECTED
Benzodiazepines: NOT DETECTED
Cocaine: NOT DETECTED
Opiates: NOT DETECTED
Tetrahydrocannabinol: NOT DETECTED

## 2016-04-24 NOTE — ED Notes (Signed)
PT DENIES SI AND HI.

## 2016-04-24 NOTE — ED Notes (Signed)
Bed: [facility] Expected date:  Expected time:  Means of arrival:  Comments: EMS/ETOH 

## 2016-04-24 NOTE — ED Provider Notes (Signed)
[facility]-EMERGENCY DEPT Provider Note   CSN: [id number] Arrival date & time: 04/24/16  1525     History   Chief Complaint Chief Complaint  Patient presents with  . Alcohol Intoxication  . Abdominal Pain  . Hematuria  . Drug / Alcohol Assessment    HPI Danny Carlson is a 51 y.o. male.  HPI    Patient is a 51 year old male presenting with his family member. Patient is intoxicated and curled on the bed. Patient's family members says that he had some blood in his urine that they found at the daughter's house. He forgot to flush the toilet and the daughter came in and saw the blood. She is concerned and brought the emergency department. Patient has mild suprapubic pain but otherwise has no complaints.   Patient is daily drinker. He reports "my whole family alcoholics".     Past Medical History:  Diagnosis Date  . EtOH dependence (HCC)     There are no active problems to display for this patient.   No past surgical history on file.     Home Medications    Prior to Admission medications   Medication Sig Start Date End Date Taking? Authorizing Provider  azithromycin (ZITHROMAX) 250 MG tablet Take 1 tablet (250 mg total) by mouth daily. Take first 2 tablets together, then 1 every day until finished. Patient not taking: Reported on 01/10/2015 03/24/13   Marissa Sciacca, PA-C  pseudoephedrine (SINUS & ALLERGY 12 HOUR) 120 MG 12 hr tablet Take 120 mg by mouth every 12 (twelve) hours as needed for congestion (congestion).    Historical Provider, MD    Family History No family history on file.  Social History Social History  Substance Use Topics  . Smoking status: Current Every Day Smoker    Packs/day: 0.50    Types: Cigarettes  . Smokeless tobacco: Never Used  . Alcohol use Yes     Allergies   Patient has no known allergies.   Review of Systems Review of Systems  Constitutional: Negative for activity change.  Respiratory: Negative for shortness of breath.    Cardiovascular: Negative for chest pain.  Gastrointestinal: Negative for abdominal pain.  Genitourinary: Positive for hematuria.  All other systems reviewed and are negative.    Physical Exam Updated Vital Signs BP 143/93 (BP Location: Left Arm)   Pulse 97   Temp 98 F (36.7 C) (Oral)   Resp 14   SpO2 100%   Physical Exam  Constitutional: He is oriented to person, place, and time. He appears well-nourished.  HENT:  Head: Normocephalic.  Eyes: Conjunctivae are normal.  Cardiovascular: Normal rate and regular rhythm.   Pulmonary/Chest: Effort normal. No respiratory distress.  Abdominal: Soft. He exhibits no distension.  Neurological: He is oriented to person, place, and time. No cranial nerve deficit.  Skin: Skin is warm and dry. He is not diaphoretic.  Psychiatric: He has a normal mood and affect. His behavior is normal.     ED Treatments / Results  Labs (all labs ordered are listed, but only abnormal results are displayed) Labs Reviewed  URINALYSIS, ROUTINE W REFLEX MICROSCOPIC - Abnormal; Notable for the following:       Result Value   Hgb urine dipstick SMALL (*)    Bacteria, UA RARE (*)    Squamous Epithelial / LPF 0-5 (*)    All other components within normal limits  RAPID URINE DRUG SCREEN, HOSP PERFORMED  LIPASE, BLOOD  COMPREHENSIVE METABOLIC PANEL  CBC  ETHANOL  EKG  EKG Interpretation None       Radiology No results found.  Procedures Procedures (including critical care time)  Medications Ordered in ED Medications - No data to display   Initial Impression / Assessment and Plan / ED Course  I have reviewed the triage vital signs and the nursing notes.  Pertinent labs & imaging results that were available during my care of the patient were reviewed by me and considered in my medical decision making (see chart for details).     Patient is a 51 year old male presenting with hematuria. Patient's family member brought him here because she  was worried hematuria she saw. Patient has mild suprapubic pain but otherwise no complaints. I offered patient an IV, labs, CAT scanned. However patient wishes to go home. Patient's family member is letting him make a decision to this point. Patient is intoxicated however appears to be capable of making his own decisions. He reports that he does not want an IV will not stay for the results and like to go home and sleep. I offered him resources for alcohol and he says that he does not want to quit drinking at this time.  We offered to family member that they can return with the patient if he chooses that he would like further therapy. Otherwise we'll give him referral for urology.  Final Clinical Impressions(s) / ED Diagnoses   Final diagnoses:  None    New Prescriptions New Prescriptions   No medications on file     Mateya Torti Randall AnLyn Abe Schools, MD 04/24/16 1647

## 2016-04-24 NOTE — Discharge Instructions (Addendum)
We did not see evidence of urinary tract infection. Hematuria can be a concerning symptom. You did not want to have any labs drawn today. If you change your mind and want to  have labs drawn, or imaging please return immediately to the emergency department. We will need to follow up with urology and the phone number given.  Labs show that is likely he will drinking heavily. Recommend that you stop drinking. We've given you resources to help with you in quitting drinking. Please follow-up with the primary care physician and he will need to follow up with a GI physician as well as because your liver showing that it is fatigued from drinking.

## 2016-04-24 NOTE — ED Triage Notes (Signed)
Per [facility] pt resides with niece in [place]. HX of ETOH. Requesting detox. Pt c/o of hematuria especially while drinking. Stomach pain lower quads developed last night. Denies N/V/D and fever. Daily drinker 2/40s admitted today.

## 2017-07-20 ENCOUNTER — Emergency Department (HOSPITAL_COMMUNITY)
Admission: EM | Admit: 2017-07-20 | Discharge: 2017-07-21 | Disposition: A | Discharge status: Home / Self Care | Payer: Self-pay | Attending: Emergency Medicine | Admitting: Emergency Medicine

## 2017-07-20 DIAGNOSIS — M25562 Pain in left knee: Secondary | ICD-10-CM | POA: Insufficient documentation

## 2017-07-20 DIAGNOSIS — F17210 Nicotine dependence, cigarettes, uncomplicated: Secondary | ICD-10-CM | POA: Insufficient documentation

## 2017-07-20 DIAGNOSIS — M25462 Effusion, left knee: Secondary | ICD-10-CM | POA: Insufficient documentation

## 2017-07-21 ENCOUNTER — Emergency Department (HOSPITAL_COMMUNITY): Payer: Self-pay

## 2017-07-21 ENCOUNTER — Encounter (HOSPITAL_COMMUNITY): Payer: Self-pay

## 2017-07-21 MED ORDER — TRAMADOL HCL 50 MG PO TABS: 50.0000 mg | ORAL_TABLET | Freq: Four times a day (QID) | ORAL | 0 refills | Status: DC | PRN

## 2017-07-21 MED ORDER — NAPROXEN 500 MG PO TABS: 500.0000 mg | ORAL_TABLET | Freq: Two times a day (BID) | ORAL | 0 refills | Status: DC

## 2017-07-21 NOTE — Discharge Instructions (Addendum)
Naproxen as prescribed.  Tramadol as prescribed as needed for pain not relieved with naproxen.  Follow up with a primary doctor if symptoms are not improving in the next 1 to 2 weeks, and return to the ER if symptoms significantly worsen or change.

## 2017-07-21 NOTE — ED Notes (Signed)
Provider at bedside

## 2017-07-21 NOTE — ED Notes (Signed)
Pt provided meal bag, will be able to get bus pass in AM
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
## 2017-07-21 NOTE — ED Notes (Signed)
L knee wrapped with ace wrap, pt provided extra wrap. Pt verbalized understanding of d/c instructions and medications.
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
## 2020-05-08 ENCOUNTER — Other Ambulatory Visit: Payer: Self-pay

## 2020-05-08 ENCOUNTER — Encounter (HOSPITAL_COMMUNITY): Payer: Self-pay

## 2020-05-08 ENCOUNTER — Emergency Department (HOSPITAL_COMMUNITY): Payer: Self-pay

## 2020-05-08 ENCOUNTER — Emergency Department (HOSPITAL_COMMUNITY)
Admission: EM | Admit: 2020-05-08 | Discharge: 2020-05-08 | Disposition: A | Discharge status: Home / Self Care | Payer: Self-pay | Attending: Emergency Medicine | Admitting: Emergency Medicine

## 2020-05-08 DIAGNOSIS — R059 Cough, unspecified: Secondary | ICD-10-CM | POA: Insufficient documentation

## 2020-05-08 DIAGNOSIS — J40 Bronchitis, not specified as acute or chronic: Secondary | ICD-10-CM

## 2020-05-08 DIAGNOSIS — F17210 Nicotine dependence, cigarettes, uncomplicated: Secondary | ICD-10-CM | POA: Insufficient documentation

## 2020-05-08 MED ORDER — ALBUTEROL SULFATE HFA 108 (90 BASE) MCG/ACT IN AERS
2.0000 | INHALATION_SPRAY | RESPIRATORY_TRACT | Status: DC
  Administered 2020-05-08: 2 via RESPIRATORY_TRACT
  Filled 2020-05-08: qty 6.7

## 2020-05-08 NOTE — ED Provider Notes (Signed)
[facility]-EMERGENCY DEPT Provider Note   CSN: [id number] Arrival date & time: 05/08/20  1015     History Chief Complaint  Patient presents with  . Cough    Danny Carlson is a 55 y.o. male.  55 year old male with history tobacco use presents with cough times several days.  Denies any fever or chills.  Is vaccinated against COVID.  No vomiting or diarrhea.  No sore throat or ear pain.  No change in taste or smell.  Symptoms persistent no treatment use prior to arrival        Past Medical History:  Diagnosis Date  . EtOH dependence (HCC)     There are no problems to display for this patient.   History reviewed. No pertinent surgical history.     History reviewed. No pertinent family history.  Social History   Tobacco Use  . Smoking status: Current Every Day Smoker    Packs/day: 0.50    Types: Cigarettes  . Smokeless tobacco: Never Used  Substance Use Topics  . Alcohol use: Yes  . Drug use: No    Home Medications Prior to Admission medications   Medication Sig Start Date End Date Taking? Authorizing Provider  azithromycin (ZITHROMAX) 250 MG tablet Take 1 tablet (250 mg total) by mouth daily. Take first 2 tablets together, then 1 every day until finished. Patient not taking: Reported on 01/10/2015 03/24/13   Sciacca, Ashok Cordia, PA-C  naproxen (NAPROSYN) 500 MG tablet Take 1 tablet (500 mg total) by mouth 2 (two) times daily. 07/21/17   Geoffery Lyons, MD  pseudoephedrine (SINUS & ALLERGY 12 HOUR) 120 MG 12 hr tablet Take 120 mg by mouth every 12 (twelve) hours as needed for congestion (congestion).    [provider]  traMADol (ULTRAM) 50 MG tablet Take 1 tablet (50 mg total) by mouth every 6 (six) hours as needed. 07/21/17   Geoffery Lyons, MD    Allergies    Patient has no known allergies.  Review of Systems   Review of Systems  All other systems reviewed and are negative.   Physical Exam Updated Vital Signs BP (!) 165/89  (BP Location: Left Arm)   Pulse 83   Temp 99 F (37.2 C) (Oral)   Resp 16   SpO2 100%   Physical Exam Vitals and nursing note reviewed.  Constitutional:      General: He is not in acute distress.    Appearance: Normal appearance. He is well-developed and well-nourished. He is not toxic-appearing.  HENT:     Head: Normocephalic and atraumatic.  Eyes:     General: Lids are normal.     Extraocular Movements: EOM normal.     Conjunctiva/sclera: Conjunctivae normal.     Pupils: Pupils are equal, round, and reactive to light.  Neck:     Thyroid: No thyroid mass.     Trachea: No tracheal deviation.  Cardiovascular:     Rate and Rhythm: Normal rate and regular rhythm.     Heart sounds: Normal heart sounds. No murmur heard. No gallop.   Pulmonary:     Effort: Pulmonary effort is normal. No respiratory distress.     Breath sounds: Normal breath sounds. No stridor. No decreased breath sounds, wheezing, rhonchi or rales.  Abdominal:     General: Bowel sounds are normal. There is no distension.     Palpations: Abdomen is soft.     Tenderness: There is no abdominal tenderness. There is no CVA tenderness or rebound.  Musculoskeletal:        General: No tenderness or edema. Normal range of motion.     Cervical back: Normal range of motion and neck supple.  Skin:    General: Skin is warm and dry.     Findings: No abrasion or rash.  Neurological:     Mental Status: He is alert and oriented to person, place, and time.     GCS: GCS eye subscore is 4. GCS verbal subscore is 5. GCS motor subscore is 6.     Cranial Nerves: No cranial nerve deficit.     Sensory: No sensory deficit.     Deep Tendon Reflexes: Strength normal.  Psychiatric:        Mood and Affect: Mood and affect normal.        Speech: Speech normal.        Behavior: Behavior normal.     ED Results / Procedures / Treatments   Labs (all labs ordered are listed, but only abnormal results are displayed) Labs Reviewed - No  data to display  EKG None  Radiology No results found.  Procedures Procedures   Medications Ordered in ED Medications - No data to display  ED Course  I have reviewed the triage vital signs and the nursing notes.  Pertinent labs & imaging results that were available during my care of the patient were reviewed by me and considered in my medical decision making (see chart for details).    MDM Rules/Calculators/A&P                          Chest x-ray consistent with bronchitis and will treat patient for same Final Clinical Impression(s) / ED Diagnoses Final diagnoses:  Cough    Rx / DC Orders ED Discharge Orders    None       Lorre Nick, MD 05/08/20 1148

## 2020-05-08 NOTE — ED Triage Notes (Signed)
Pt presents with c/o cough for a couple of days. Pt reports he is coughing up green mucus.

## 2021-10-12 ENCOUNTER — Emergency Department
Admission: EM | Admit: 2021-10-12 | Discharge: 2021-10-13 | Disposition: A | Discharge status: Home / Self Care | Payer: Self-pay | Attending: Emergency Medicine | Admitting: Emergency Medicine

## 2021-10-12 ENCOUNTER — Encounter: Payer: Self-pay | Admitting: Emergency Medicine

## 2021-10-12 ENCOUNTER — Emergency Department: Payer: Self-pay

## 2021-10-12 ENCOUNTER — Other Ambulatory Visit: Payer: Self-pay

## 2021-10-12 DIAGNOSIS — R002 Palpitations: Secondary | ICD-10-CM | POA: Insufficient documentation

## 2021-10-12 DIAGNOSIS — T679XXA Effect of heat and light, unspecified, initial encounter: Secondary | ICD-10-CM | POA: Insufficient documentation

## 2021-10-12 DIAGNOSIS — R319 Hematuria, unspecified: Secondary | ICD-10-CM | POA: Insufficient documentation

## 2021-10-12 DIAGNOSIS — R0981 Nasal congestion: Secondary | ICD-10-CM | POA: Insufficient documentation

## 2021-10-12 DIAGNOSIS — R059 Cough, unspecified: Secondary | ICD-10-CM | POA: Insufficient documentation

## 2021-10-12 LAB — CBC WITH DIFFERENTIAL/PLATELET
Abs Immature Granulocytes: 0.02 10*3/uL (ref 0.00–0.07)
Basophils Absolute: 0 10*3/uL (ref 0.0–0.1)
Basophils Relative: 1 %
Eosinophils Absolute: 0.1 10*3/uL (ref 0.0–0.5)
Eosinophils Relative: 1 %
HCT: 36 % — ABNORMAL LOW (ref 39.0–52.0)
Hemoglobin: 12.4 g/dL — ABNORMAL LOW (ref 13.0–17.0)
Immature Granulocytes: 0 %
Lymphocytes Relative: 32 %
Lymphs Abs: 2 10*3/uL (ref 0.7–4.0)
MCH: 35.2 pg — ABNORMAL HIGH (ref 26.0–34.0)
MCHC: 34.4 g/dL (ref 30.0–36.0)
MCV: 102.3 fL — ABNORMAL HIGH (ref 80.0–100.0)
Monocytes Absolute: 0.8 10*3/uL (ref 0.1–1.0)
Monocytes Relative: 13 %
Neutro Abs: 3.3 10*3/uL (ref 1.7–7.7)
Neutrophils Relative %: 53 %
Platelets: 65 10*3/uL — ABNORMAL LOW (ref 150–400)
RBC: 3.52 MIL/uL — ABNORMAL LOW (ref 4.22–5.81)
RDW: 11.9 % (ref 11.5–15.5)
Smear Review: NORMAL
WBC: 6.3 10*3/uL (ref 4.0–10.5)
nRBC: 0 % (ref 0.0–0.2)

## 2021-10-12 LAB — BASIC METABOLIC PANEL
Anion gap: 7 (ref 5–15)
BUN: 19 mg/dL (ref 6–20)
CO2: 27 mmol/L (ref 22–32)
Calcium: 9 mg/dL (ref 8.9–10.3)
Chloride: 100 mmol/L (ref 98–111)
Creatinine, Ser: 0.87 mg/dL (ref 0.61–1.24)
GFR, Estimated: >60 mL/min (ref >60)
Glucose, Bld: 105 mg/dL — ABNORMAL HIGH (ref 70–99)
Potassium: 3.6 mmol/L (ref 3.5–5.1)
Sodium: 134 mmol/L — ABNORMAL LOW (ref 135–145)

## 2021-10-12 LAB — BRAIN NATRIURETIC PEPTIDE: B Natriuretic Peptide: 13.5 pg/mL (ref 0.0–100.0)

## 2021-10-12 LAB — MAGNESIUM: Magnesium: 2.2 mg/dL (ref 1.7–2.4)

## 2021-10-12 LAB — TROPONIN I (HIGH SENSITIVITY)
Troponin I (High Sensitivity): 7 ng/L (ref <18)
Troponin I (High Sensitivity): 8 ng/L (ref <18)

## 2021-10-12 MED ORDER — SODIUM CHLORIDE 0.9 % IV BOLUS
1000.0000 mL | Freq: Once | INTRAVENOUS | Status: AC
  Administered 2021-10-12: 1000 mL via INTRAVENOUS

## 2021-10-12 NOTE — ED Triage Notes (Addendum)
Patient to ED via [facility] after "walking outside in the heat all day." Patient states he felt like his heart is skipping a beat. Also patient is concerned about possible spider bite on left inner thigh and some congestion. Denies any pain at this time.

## 2021-10-12 NOTE — ED Triage Notes (Signed)
First Nurse Note:  Pt via EMS from [facility]. Pt started having palpitations 1 hour ago. Initial HR 110, HR now 80s. Pt is A&Ox4 and NAD  166/78 99.0 orally 80s HR 100% on RA

## 2021-10-12 NOTE — ED Notes (Signed)
Pt brought to ED rm 8 at this time, this RN now assuming care. 

## 2021-10-12 NOTE — ED Notes (Signed)
ED Provider at bedside. 

## 2021-10-12 NOTE — ED Provider Notes (Signed)
[facility] Provider Note    Event Date/Time   First MD Initiated Contact with Patient 10/12/21 2300     (approximate)   History   Chest Pain   HPI  Danny Carlson is a 56 y.o. male brought to the ED via EMS from [facility] with a chief complaint of palpitations.  Patient walks 3 miles in the 90+ degree heat.  When he got to [employer] he felt like his heart was skipping beats.  Patient also is concerned about a possible spider bite to his left leg.  Endorses productive cough and nasal congestion.  His chest pain, shortness of breath, abdominal pain, nausea, vomiting or dizziness.     Past Medical History   Past Medical History:  Diagnosis Date   EtOH dependence (HCC)      Active Problem List  There are no problems to display for this patient.    Past Surgical History  History reviewed. No pertinent surgical history.   Home Medications   Prior to Admission medications   Medication Sig Start Date End Date Taking? Authorizing Provider  azithromycin (ZITHROMAX) 250 MG tablet Take 1 tablet (250 mg total) by mouth daily. Take first 2 tablets together, then 1 every day until finished. Patient not taking: Reported on 01/10/2015 03/24/13   Sciacca, Ashok Cordia, PA-C  naproxen (NAPROSYN) 500 MG tablet Take 1 tablet (500 mg total) by mouth 2 (two) times daily. 07/21/17   Geoffery Lyons, MD  pseudoephedrine (SINUS & ALLERGY 12 HOUR) 120 MG 12 hr tablet Take 120 mg by mouth every 12 (twelve) hours as needed for congestion (congestion).    [provider]  traMADol (ULTRAM) 50 MG tablet Take 1 tablet (50 mg total) by mouth every 6 (six) hours as needed. 07/21/17   Geoffery Lyons, MD     Allergies  Patient has no known allergies.   Family History  No family history on file.   Physical Exam  Triage Vital Signs: ED Triage Vitals  Enc Vitals Group     BP 10/12/21 1824 (!) 169/80     Pulse Rate 10/12/21 1824 82     Resp  10/12/21 1824 18     Temp 10/12/21 1824 98.3 F (36.8 C)     Temp Source 10/12/21 1824 Oral     SpO2 10/12/21 1824 98 %     Weight 10/12/21 1826 120 lb (54.4 kg)     Height 10/12/21 1826 5\' 4"  (1.626 m)     Head Circumference --      Peak Flow --      Pain Score 10/12/21 1825 0     Pain Loc --      Pain Edu? --      Excl. in GC? --     Updated Vital Signs: BP (!) 149/77   Pulse 74   Temp 98.5 F (36.9 C)   Resp 18   Ht 5\' 4"  (1.626 m)   Wt 54.4 kg   SpO2 97%   BMI 20.60 kg/m    General: Awake, no distress.  CV:  RRR good peripheral perfusion.  Resp:  Normal effort.  CTAB. Abd:  Nontender no distention.  Other:  No thyromegaly.  Minuscule bug bite noted to left posterior thigh without pain marks.  No warmth, erythema or fluctuance.   ED Results / Procedures / Treatments  Labs (all labs ordered are listed, but only abnormal results are displayed) Labs Reviewed  CBC WITH DIFFERENTIAL/PLATELET - Abnormal;  Notable for the following components:      Result Value   RBC 3.52 (*)    Hemoglobin 12.4 (*)    HCT 36.0 (*)    MCV 102.3 (*)    MCH 35.2 (*)    Platelets 65 (*)    All other components within normal limits  BASIC METABOLIC PANEL - Abnormal; Notable for the following components:   Sodium 134 (*)    Glucose, Bld 105 (*)    All other components within normal limits  URINALYSIS, COMPLETE (UACMP) WITH MICROSCOPIC - Abnormal; Notable for the following components:   Color, Urine AMBER (*)    APPearance CLEAR (*)    Hgb urine dipstick MODERATE (*)    Bilirubin Urine SMALL (*)    RBC / HPF >50 (*)    All other components within normal limits  BRAIN NATRIURETIC PEPTIDE  MAGNESIUM  CK  TSH  T4, FREE  TROPONIN I (HIGH SENSITIVITY)  TROPONIN I (HIGH SENSITIVITY)     EKG  ED ECG REPORT I, Deriana Vanderhoef J, the attending physician, personally viewed and interpreted this ECG.   Date: 10/12/2021  EKG Time: 1835  Rate: 84  Rhythm: normal sinus rhythm  Axis:  Normal  Intervals:none  ST&T Change: Nonspecific    RADIOLOGY I have independently visualized and interpreted patient's chest x-ray as well as noted the radiology interpretation:  Chest x-ray: Unremarkable  Official radiology report(s): DG Chest 2 View  Result Date: 10/13/2021 CLINICAL DATA:  Palpitations EXAM: CHEST - 2 VIEW COMPARISON:  05/08/2020 FINDINGS: The heart size and mediastinal contours are within normal limits. Both lungs are clear. The visualized skeletal structures are unremarkable. IMPRESSION: Normal study. Electronically Signed   By: Charlett Nose M.D.   On: 10/13/2021 00:13     PROCEDURES:  Critical Care performed: No  .1-3 Lead EKG Interpretation  Performed by: Irean Hong, MD Authorized by: Irean Hong, MD     Interpretation: normal     ECG rate:  66   ECG rate assessment: normal     Rhythm: sinus rhythm     Ectopy: none     Conduction: normal   Comments:     Patient placed on cardiac monitor to evaluate for arrhythmia    MEDICATIONS ORDERED IN ED: Medications  sodium chloride 0.9 % bolus 1,000 mL (0 mLs Intravenous Stopped 10/13/21 0020)     IMPRESSION / MDM / ASSESSMENT AND PLAN / ED COURSE  I reviewed the triage vital signs and the nursing notes.                             56 year old male presenting with palpitations after walking 3 miles in 90+ degree heat. Differential diagnosis includes, but is not limited to, ACS, aortic dissection, pulmonary embolism, cardiac tamponade, pneumothorax, pneumonia, pericarditis, myocarditis, GI-related causes including esophagitis/gastritis, and musculoskeletal chest wall pain.   I have personally reviewed patient's records and see a number of emergency department visits, last 1 in March 2022 for bronchitis  Patient's presentation is most consistent with acute complicated illness / injury requiring diagnostic workup.  The patient is on the cardiac monitor to evaluate for evidence of arrhythmia and/or  significant heart rate changes.  Laboratory results demonstrate normal WBC 6.3, normal electrolytes, 2 sets of troponin negative.  Will check CK, thyroid panel and chest x-ray.  Patient reports feeling better with rest calmly.  Asking for food.  Will initiate IV hydration and reassess.  Clinical Course as of 10/13/21 [id number]  Wynelle Link Oct 13, 2021  0105 Patient resting in no acute distress.  After eating a snack.  Updated him on negative thyroid panel, repeat troponin and CK results.  Return precautions given.  Patient verbalizes understanding and agrees with plan of care. [JS]  0229 Patient's discharge was held because he urinated blood at discharge.  States it is much better now. Will obtain UA. [JS]  [id number] Updated patient on UA results.  Strict return precautions given.  Patient verbalizes understanding and agrees with plan of care. [JS]    Clinical Course User Index [JS] Irean Hong, MD     FINAL CLINICAL IMPRESSION(S) / ED DIAGNOSES   Final diagnoses:  Palpitations  Heat exposure, initial encounter  Hematuria, unspecified type     Rx / DC Orders   ED Discharge Orders     None        Note:  This document was prepared using Dragon voice recognition software and may include unintentional dictation errors.   Irean Hong, MD 10/13/21 [phone]

## 2021-10-12 NOTE — ED Provider Triage Note (Signed)
Emergency Medicine Provider Triage Evaluation Note  Danny Carlson , a 56 y.o. male  was evaluated in triage.  Pt complains of palpitations after walking in the heat. Reports that it felt like his heart was skipping beats. Also thinks he was bit by a spider to his thigh. Had itchiness. No fevers. No CP/SOB.  Review of Systems  Positive: palpitations Negative: fevers  Physical Exam  There were no vitals taken for this visit. Gen:   Awake, no distress   Resp:  Normal effort  MSK:   Moves extremities without difficulty  Other:    Medical Decision Making  Medically screening exam initiated at 6:23 PM.  Appropriate orders placed.  Danny Carlson was informed that the remainder of the evaluation will be completed by another provider, this initial triage assessment does not replace that evaluation, and the importance of remaining in the ED until their evaluation is complete.     Jackelyn Hoehn, PA-C 10/12/21 1826

## 2021-10-13 LAB — URINALYSIS, COMPLETE (UACMP) WITH MICROSCOPIC
Bacteria, UA: NONE SEEN
Glucose, UA: NEGATIVE mg/dL
Ketones, ur: NEGATIVE mg/dL
Leukocytes,Ua: NEGATIVE
Nitrite: NEGATIVE
Protein, ur: NEGATIVE mg/dL
RBC / HPF: >50 RBC/hpf — ABNORMAL HIGH (ref 0–5)
Specific Gravity, Urine: 1.018 (ref 1.005–1.030)
pH: 7 (ref 5.0–8.0)

## 2021-10-13 LAB — CK: Total CK: 181 U/L (ref 49–397)

## 2021-10-13 LAB — TSH: TSH: 1.886 u[IU]/mL (ref 0.350–4.500)

## 2021-10-13 LAB — T4, FREE: Free T4: 0.77 ng/dL (ref 0.61–1.12)

## 2021-10-13 NOTE — Discharge Instructions (Addendum)
Stay indoors in air conditioning and drink plenty of fluids daily.  Return to the ER for worsening symptoms, persistent vomiting, difficulty breathing or other concerns.

## 2021-10-13 NOTE — ED Notes (Signed)
This RN attempted to DC pt, he then stated he had to urinate. After urinating he stated that there "was blood coming out of my penis" MD notified. She sts she will see pt as soon as she is able.

## 2021-10-13 NOTE — ED Notes (Signed)
Signing pad did not work, pt verbalized understanding of dc instructions.  

## 2021-10-13 NOTE — ED Notes (Signed)
ED Provider at bedside. 

## 2021-10-27 IMAGING — CR DG CHEST 2V
2 series · 2 of 2 positions shown · non-contrast
Comparison: 03/24/2013

CLINICAL DATA: Cough for a couple days, cough productive of green
mucus, smoker

EXAM:
CHEST - 2 VIEW

[w chest pa]
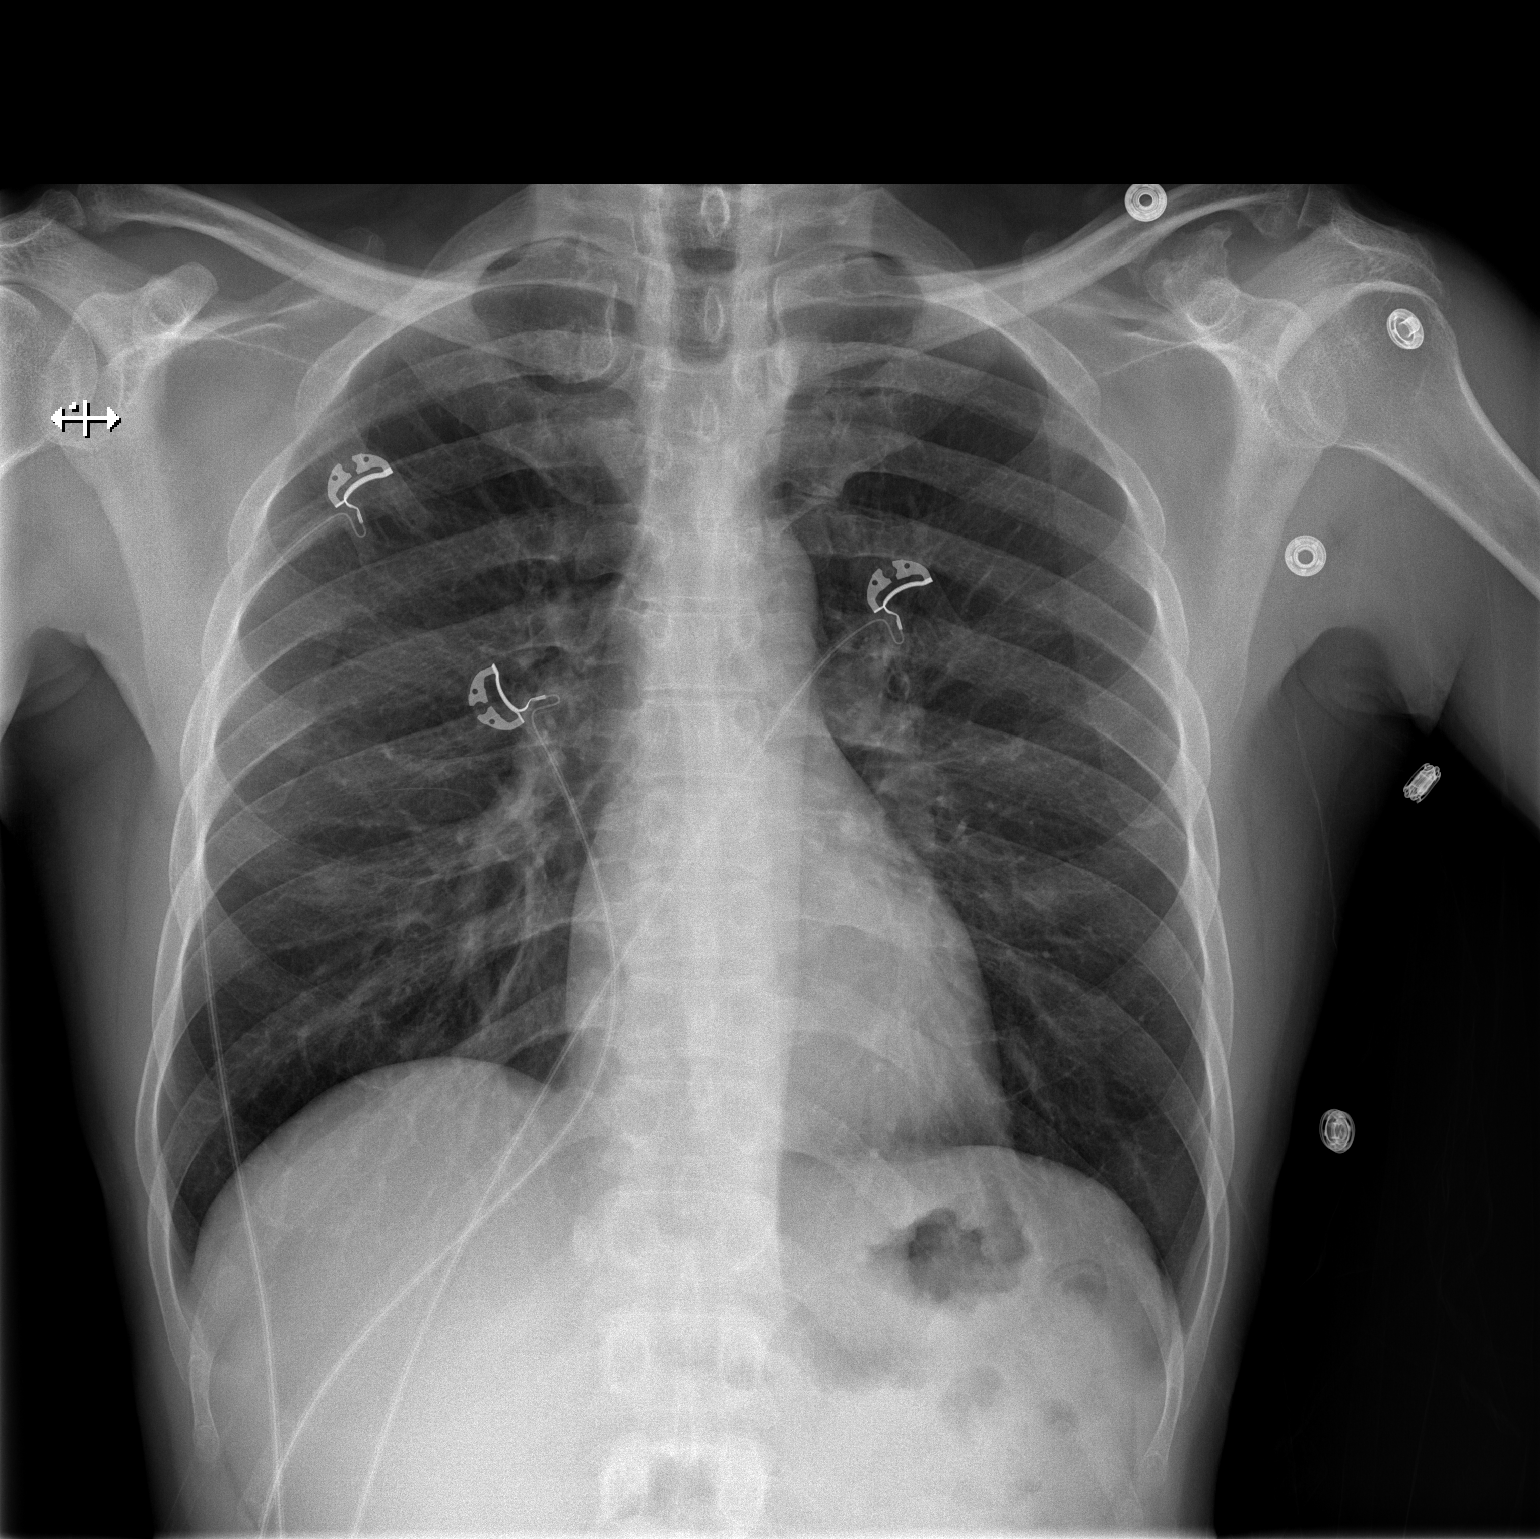

[w chest lat]
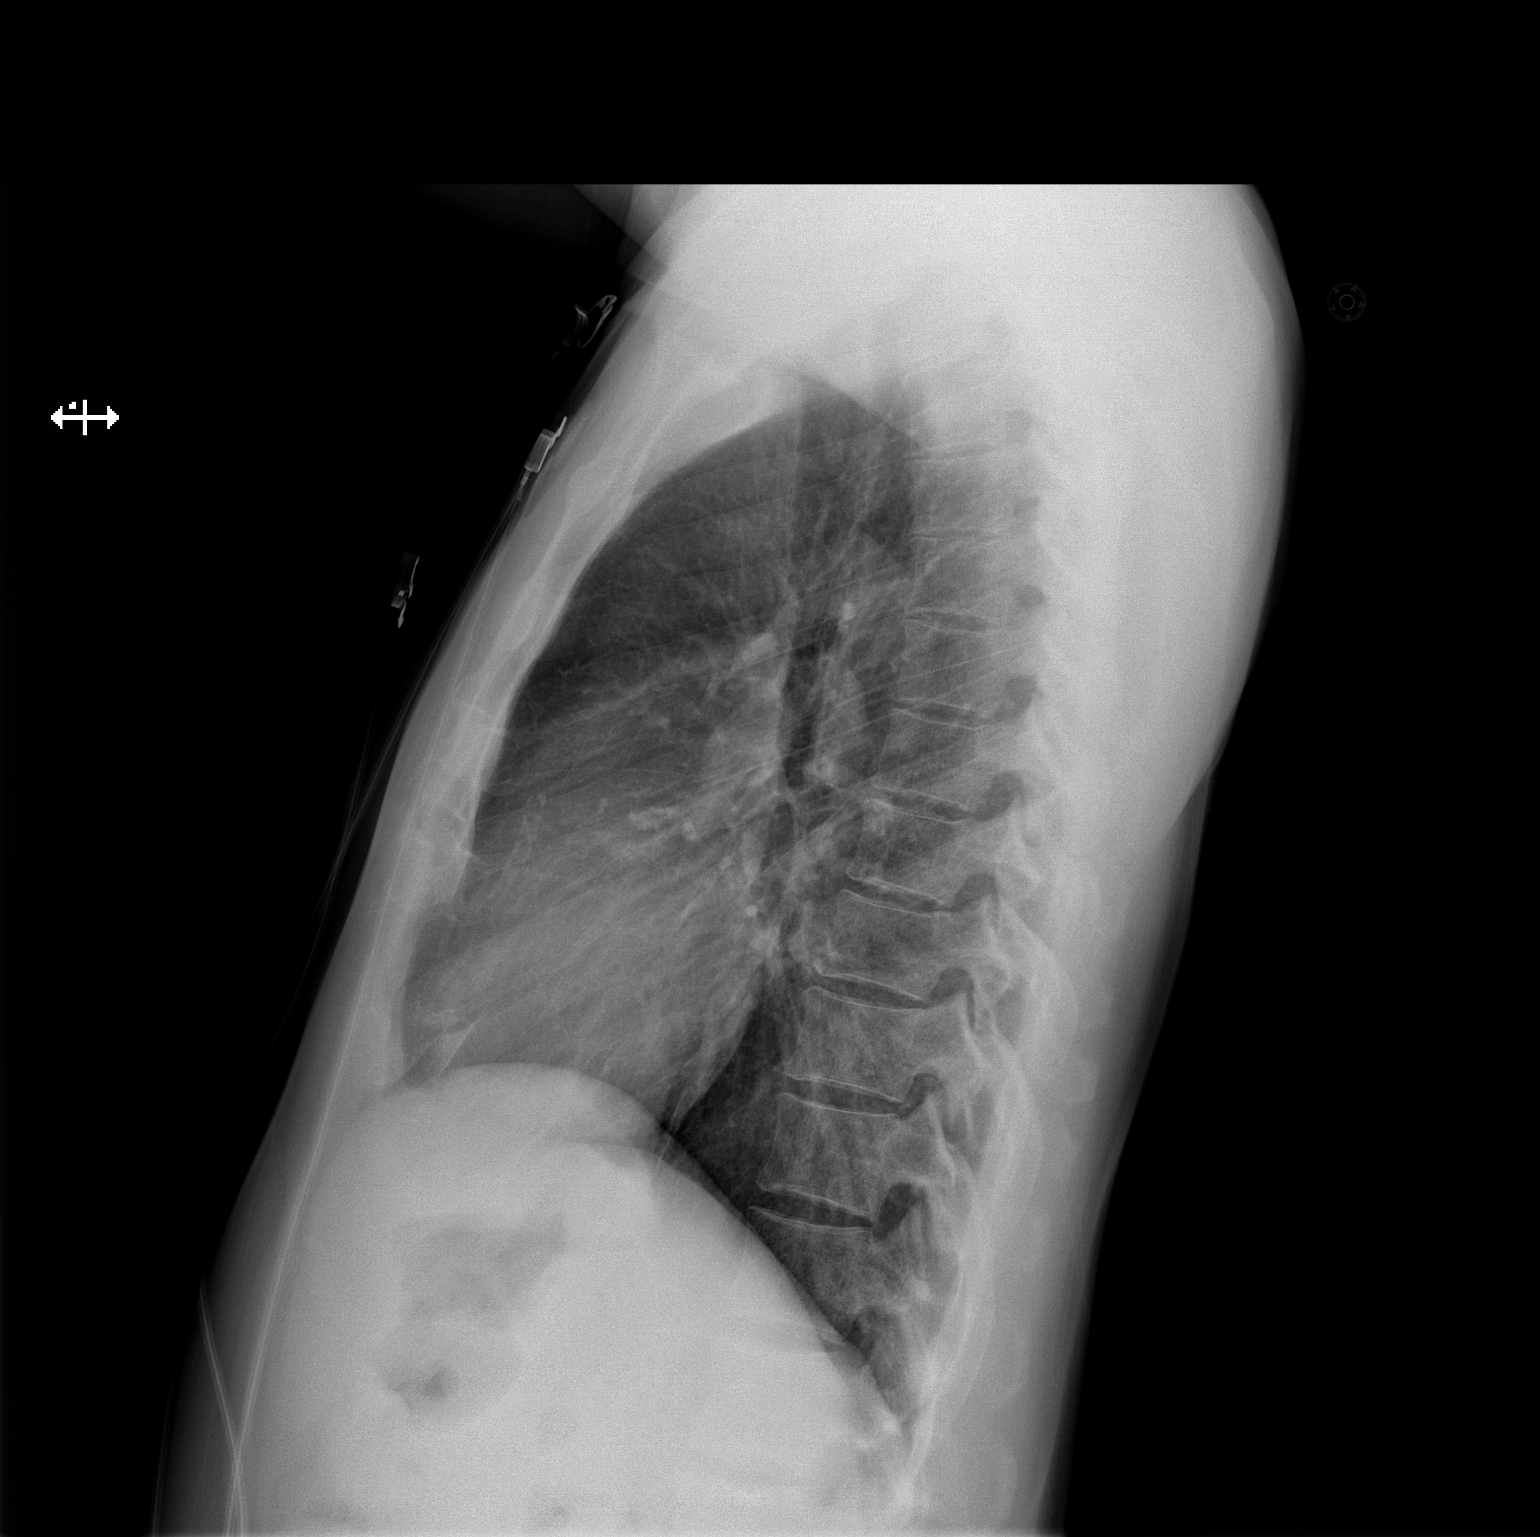

[2 of 2 positions shown; findings below may reference images not displayed]

FINDINGS: Normal heart size, mediastinal contours, and pulmonary vascularity.

Coronary arterial calcifications on lateral view.

Minimal chronic bronchitic changes.

Lungs otherwise clear.

No pleural effusion or pneumothorax.

Bones unremarkable.
IMPRESSION: Minimal chronic bronchitic changes without infiltrate.

Coronary arterial calcifications.

## 2022-01-23 ENCOUNTER — Emergency Department (HOSPITAL_COMMUNITY)
Admission: EM | Admit: 2022-01-23 | Discharge: 2022-01-23 | Disposition: A | Discharge status: Home / Self Care | Payer: Self-pay | Attending: Emergency Medicine | Admitting: Emergency Medicine

## 2022-01-23 ENCOUNTER — Emergency Department (HOSPITAL_COMMUNITY): Payer: Self-pay

## 2022-01-23 ENCOUNTER — Encounter (HOSPITAL_COMMUNITY): Payer: Self-pay

## 2022-01-23 DIAGNOSIS — F10920 Alcohol use, unspecified with intoxication, uncomplicated: Secondary | ICD-10-CM

## 2022-01-23 DIAGNOSIS — F10129 Alcohol abuse with intoxication, unspecified: Secondary | ICD-10-CM | POA: Insufficient documentation

## 2022-01-23 DIAGNOSIS — Z5900 Homelessness unspecified: Secondary | ICD-10-CM | POA: Insufficient documentation

## 2022-01-23 DIAGNOSIS — Y908 Blood alcohol level of 240 mg/100 ml or more: Secondary | ICD-10-CM | POA: Insufficient documentation

## 2022-01-23 DIAGNOSIS — R7989 Other specified abnormal findings of blood chemistry: Secondary | ICD-10-CM | POA: Insufficient documentation

## 2022-01-23 LAB — I-STAT CHEM 8, ED
BUN: 5 mg/dL — ABNORMAL LOW (ref 6–20)
Calcium, Ion: 1.07 mmol/L — ABNORMAL LOW (ref 1.15–1.40)
Chloride: 99 mmol/L (ref 98–111)
Creatinine, Ser: 1.2 mg/dL (ref 0.61–1.24)
Glucose, Bld: 122 mg/dL — ABNORMAL HIGH (ref 70–99)
HCT: 37 % — ABNORMAL LOW (ref 39.0–52.0)
Hemoglobin: 12.6 g/dL — ABNORMAL LOW (ref 13.0–17.0)
Potassium: 3.7 mmol/L (ref 3.5–5.1)
Sodium: 135 mmol/L (ref 135–145)
TCO2: 22 mmol/L (ref 22–32)

## 2022-01-23 LAB — CBC WITH DIFFERENTIAL/PLATELET
Abs Immature Granulocytes: 0.03 10*3/uL (ref 0.00–0.07)
Basophils Absolute: 0 10*3/uL (ref 0.0–0.1)
Basophils Relative: 1 %
Eosinophils Absolute: 0.1 10*3/uL (ref 0.0–0.5)
Eosinophils Relative: 1 %
HCT: 35 % — ABNORMAL LOW (ref 39.0–52.0)
Hemoglobin: 12.3 g/dL — ABNORMAL LOW (ref 13.0–17.0)
Immature Granulocytes: 0 %
Lymphocytes Relative: 36 %
Lymphs Abs: 2.5 10*3/uL (ref 0.7–4.0)
MCH: 35.8 pg — ABNORMAL HIGH (ref 26.0–34.0)
MCHC: 35.1 g/dL (ref 30.0–36.0)
MCV: 101.7 fL — ABNORMAL HIGH (ref 80.0–100.0)
Monocytes Absolute: 1 10*3/uL (ref 0.1–1.0)
Monocytes Relative: 14 %
Neutro Abs: 3.4 10*3/uL (ref 1.7–7.7)
Neutrophils Relative %: 48 %
Platelets: 83 10*3/uL — ABNORMAL LOW (ref 150–400)
RBC: 3.44 MIL/uL — ABNORMAL LOW (ref 4.22–5.81)
RDW: 11.9 % (ref 11.5–15.5)
WBC: 7 10*3/uL (ref 4.0–10.5)
nRBC: 0 % (ref 0.0–0.2)

## 2022-01-23 LAB — COMPREHENSIVE METABOLIC PANEL
ALT: 58 U/L — ABNORMAL HIGH (ref 0–44)
AST: 76 U/L — ABNORMAL HIGH (ref 15–41)
Albumin: 2.9 g/dL — ABNORMAL LOW (ref 3.5–5.0)
Alkaline Phosphatase: 81 U/L (ref 38–126)
Anion gap: 12 (ref 5–15)
BUN: 6 mg/dL (ref 6–20)
CO2: 21 mmol/L — ABNORMAL LOW (ref 22–32)
Calcium: 8.5 mg/dL — ABNORMAL LOW (ref 8.9–10.3)
Chloride: 100 mmol/L (ref 98–111)
Creatinine, Ser: 0.68 mg/dL (ref 0.61–1.24)
GFR, Estimated: >60 mL/min (ref >60)
Glucose, Bld: 122 mg/dL — ABNORMAL HIGH (ref 70–99)
Potassium: 3.5 mmol/L (ref 3.5–5.1)
Sodium: 133 mmol/L — ABNORMAL LOW (ref 135–145)
Total Bilirubin: 0.9 mg/dL (ref 0.3–1.2)
Total Protein: 7.6 g/dL (ref 6.5–8.1)

## 2022-01-23 LAB — ACETAMINOPHEN LEVEL: Acetaminophen (Tylenol), Serum: <10 ug/mL — ABNORMAL LOW (ref 10–30)

## 2022-01-23 LAB — ETHANOL: Alcohol, Ethyl (B): 422 mg/dL (ref <10)

## 2022-01-23 LAB — SALICYLATE LEVEL: Salicylate Lvl: <7 mg/dL — ABNORMAL LOW (ref 7.0–30.0)

## 2022-01-23 MED ORDER — IOHEXOL 350 MG/ML SOLN
75.0000 mL | Freq: Once | INTRAVENOUS | Status: AC | PRN
  Administered 2022-01-23: 75 mL via INTRAVENOUS

## 2022-01-23 NOTE — ED Provider Notes (Signed)
[facility] EMERGENCY DEPARTMENT Provider Note   CSN: [id number] Arrival date & time: 01/23/22  1454     History  Intoxication  Danny Carlson is a 56 y.o. male brought in by EMS for EtOH intoxication.  Patient recently lost family member.  He is homeless.  Apparently had a trip and fall getting out of a car today.  Car was not moving.  EMS arrival patient smelled like alcohol, Appeared to be intoxicated.  He had some blood from his mouth.  Patient denied any complaints.  Refused c-collar with EMS.  On my initial evaluation he just states he wants to sleep.  HPI     Home Medications Prior to Admission medications   Medication Sig Start Date End Date Taking? Authorizing Provider  azithromycin (ZITHROMAX) 250 MG tablet Take 1 tablet (250 mg total) by mouth daily. Take first 2 tablets together, then 1 every day until finished. Patient not taking: Reported on 01/10/2015 03/24/13   Sciacca, Mable Fill, PA-C  naproxen (NAPROSYN) 500 MG tablet Take 1 tablet (500 mg total) by mouth 2 (two) times daily. 07/21/17   Veryl Speak, MD  pseudoephedrine (SINUS & ALLERGY 12 HOUR) 120 MG 12 hr tablet Take 120 mg by mouth every 12 (twelve) hours as needed for congestion (congestion).    [provider]  traMADol (ULTRAM) 50 MG tablet Take 1 tablet (50 mg total) by mouth every 6 (six) hours as needed. 07/21/17   Veryl Speak, MD      Allergies    Patient has no known allergies.    Review of Systems   Review of Systems  Unable to perform ROS: Mental status change  Constitutional: Negative.   HENT: Negative.    Respiratory: Negative.    Cardiovascular: Negative.   Gastrointestinal: Negative.   Genitourinary: Negative.   Musculoskeletal: Negative.   Skin: Negative.   Neurological: Negative.   All other systems reviewed and are negative.   Physical Exam Updated Vital Signs BP (!) 142/77   Pulse 86   Temp 98.6 F (37 C) (Oral)   Resp 16   Ht _0  (1.626 m)    Wt 55 kg   SpO2 99%   BMI 20.81 kg/m  Physical Exam Vitals and nursing note reviewed.  Constitutional:      General: He is not in acute distress.    Appearance: He is well-developed. He is not ill-appearing, toxic-appearing or diaphoretic.     Comments: Smells of Etoh, disheveled   HENT:     Head: Normocephalic and atraumatic.     Jaw: There is normal jaw occlusion.     Comments: No drooling, dysphagia or trismus.  No obvious lacerations    Nose: Nose normal.     Mouth/Throat:     Lips: Pink.     Mouth: Mucous membranes are moist.     Pharynx: Oropharynx is clear. Uvula midline.     Comments: Tongue midline, no loose dentition Eyes:     Pupils: Pupils are equal, round, and reactive to light.  Neck:     Trachea: Trachea and phonation normal.     Comments: No midline cervical tenderness Cardiovascular:     Rate and Rhythm: Normal rate and regular rhythm.     Pulses: Normal pulses.          Radial pulses are 2+ on the right side and 2+ on the left side.     Heart sounds: Normal heart sounds.  Pulmonary:     Effort:  Pulmonary effort is normal. No respiratory distress.     Breath sounds: Normal breath sounds and air entry.  Chest:     Comments: Non tender, no crepitus, step off Abdominal:     General: Bowel sounds are normal. There is no distension.     Palpations: Abdomen is soft.     Tenderness: There is no abdominal tenderness.     Comments: Non tender  Musculoskeletal:        General: Normal range of motion.     Cervical back: Full passive range of motion without pain, normal range of motion and neck supple.     Comments: Moves all 4 extremities without difficulty  Skin:    General: Skin is warm and dry.  Neurological:     General: No focal deficit present.     Mental Status: He is alert and oriented to person, place, and time.     Sensory: Sensation is intact.     Motor: Motor function is intact.     Coordination: Coordination is intact.     Comments: Follows  commands.  Moves all 4 extremities without difficulty. Slurred speech     ED Results / Procedures / Treatments   Labs (all labs ordered are listed, but only abnormal results are displayed) Labs Reviewed  CBC WITH DIFFERENTIAL/PLATELET - Abnormal; Notable for the following components:      Result Value   RBC 3.44 (*)    Hemoglobin 12.3 (*)    HCT 35.0 (*)    MCV 101.7 (*)    MCH 35.8 (*)    Platelets 83 (*)    All other components within normal limits  COMPREHENSIVE METABOLIC PANEL - Abnormal; Notable for the following components:   Sodium 133 (*)    CO2 21 (*)    Glucose, Bld 122 (*)    Calcium 8.5 (*)    Albumin 2.9 (*)    AST 76 (*)    ALT 58 (*)    All other components within normal limits  ETHANOL - Abnormal; Notable for the following components:   Alcohol, Ethyl (B) 422 (*)    All other components within normal limits  SALICYLATE LEVEL - Abnormal; Notable for the following components:   Salicylate Lvl <9.6 (*)    All other components within normal limits  ACETAMINOPHEN LEVEL - Abnormal; Notable for the following components:   Acetaminophen (Tylenol), Serum <10 (*)    All other components within normal limits  I-STAT CHEM 8, ED - Abnormal; Notable for the following components:   BUN 5 (*)    Glucose, Bld 122 (*)    Calcium, Ion 1.07 (*)    Hemoglobin 12.6 (*)    HCT 37.0 (*)    All other components within normal limits    EKG None  Radiology CT CHEST ABDOMEN PELVIS W CONTRAST  Result Date: 01/23/2022 CLINICAL DATA:  Polytrauma, blunt. Pt arrives via EMS with ETOH intoxication. Pt recently lost his brother, homeless. Recent fall getting out of car today. EXAM: CT CHEST, ABDOMEN, AND PELVIS WITH CONTRAST TECHNIQUE: Multidetector CT imaging of the chest, abdomen and pelvis was performed following the standard protocol during bolus administration of intravenous contrast. RADIATION DOSE REDUCTION: This exam was performed according to the departmental  dose-optimization program which includes automated exposure control, adjustment of the mA and/or kV according to patient size and/or use of iterative reconstruction technique. CONTRAST:  79m OMNIPAQUE IOHEXOL 350 MG/ML SOLN COMPARISON:  None Available. FINDINGS: CHEST: Cardiovascular: No aortic injury. The  thoracic aorta is normal in caliber. The heart is normal in size. No significant pericardial effusion. At least 3 vessel coronary calcification. Mediastinum/Nodes: No pneumomediastinum. No mediastinal hematoma. The esophagus is unremarkable. The thyroid is unremarkable. The central airways are patent. No mediastinal, hilar, or axillary lymphadenopathy. Lungs/Pleura: No focal consolidation. No pulmonary nodule. No pulmonary mass. No pulmonary contusion or laceration. No pneumatocele formation. No pleural effusion. No pneumothorax. No hemothorax. Musculoskeletal/Chest wall: Bilateral asymmetric left greater than right gynecomastia. No acute rib or sternal fracture. No spinal fracture. ABDOMEN / PELVIS: Hepatobiliary: Not enlarged. No focal lesion. No laceration or subcapsular hematoma. Calcified gallstones within the gallbladder lumen. No gallbladder wall thickening or pericholecystic fluid. No biliary ductal dilatation. Pancreas: Normal pancreatic contour. No main pancreatic duct dilatation. Spleen: Not enlarged. No focal lesion. No laceration, subcapsular hematoma, or vascular injury. Adrenals/Urinary Tract: No nodularity bilaterally. Bilateral kidneys enhance symmetrically. No hydronephrosis. No contusion, laceration, or subcapsular hematoma. No injury to the vascular structures or collecting systems. No hydroureter. The urinary bladder is unremarkable. On delayed imaging, there is no urothelial wall thickening and there are no filling defects in the opacified portions of the bilateral collecting systems or ureters. Stomach/Bowel: No small or large bowel wall thickening or dilatation. The appendix is  unremarkable. Vasculature/Lymphatics: Mild to moderate atherosclerotic plaque. No abdominal aorta or iliac aneurysm. No active contrast extravasation or pseudoaneurysm. No abdominal, pelvic, inguinal lymphadenopathy. Reproductive: Prostate unremarkable. Other: No simple free fluid ascites. No pneumoperitoneum. No hemoperitoneum. No mesenteric hematoma identified. No organized fluid collection. Musculoskeletal: No significant soft tissue hematoma. No acute pelvic fracture. No spinal fracture. Mild retrolisthesis of L5 on S1. Ports and Devices: None. IMPRESSION: 1. No acute traumatic injury to the chest, abdomen, or pelvis. 2. No acute fracture or traumatic malalignment of the thoracic or lumbar spine. 3. Other imaging findings of potential clinical significance: Bilateral asymmetric left greater than right gynecomastia-recommend correlation with physical exam, if clinically indicated please consider diagnostic mammogram for further evaluation. Comparison with prior cross-sectional imaging would be of value to evaluate stability. Electronically Signed   By: Iven Finn M.D.   On: 01/23/2022 19:32   CT HEAD WO CONTRAST (5MM)  Result Date: 01/23/2022 CLINICAL DATA:  Delirium ams, etoh, fall; Facial trauma, blunt fall, etoh, ams; Neck trauma, dangerous injury mechanism (Age 17-64y) etoh, amd, facial injury EXAM: CT HEAD WITHOUT CONTRAST CT MAXILLOFACIAL WITHOUT CONTRAST CT CERVICAL SPINE WITHOUT CONTRAST TECHNIQUE: Multidetector CT imaging of the head, cervical spine, and maxillofacial structures were performed using the standard protocol without intravenous contrast. Multiplanar CT image reconstructions of the cervical spine and maxillofacial structures were also generated. RADIATION DOSE REDUCTION: This exam was performed according to the departmental dose-optimization program which includes automated exposure control, adjustment of the mA and/or kV according to patient size and/or use of iterative  reconstruction technique. COMPARISON:  None Available. FINDINGS: CT HEAD FINDINGS BRAIN: BRAIN Patchy and confluent areas of decreased attenuation are noted throughout the deep and periventricular white matter of the cerebral hemispheres bilaterally, compatible with chronic microvascular ischemic disease. No evidence of large-territorial acute infarction. No parenchymal hemorrhage. No mass lesion. No extra-axial collection. No mass effect or midline shift. No hydrocephalus. Basilar cisterns are patent. Vascular: No hyperdense vessel. Atherosclerotic calcifications are present within the cavernous internal carotid arteries. Skull: No acute fracture or focal lesion. Other: None. CT MAXILLOFACIAL FINDINGS Osseous: No fracture or mandibular dislocation. No destructive process. Sinuses/Orbits: Paranasal sinuses and mastoid air cells are clear. The orbits are unremarkable. Soft tissues: Negative. CT CERVICAL  SPINE FINDINGS Alignment: Normal. Skull base and vertebrae: Mild multilevel degenerative changes of the spine. No acute fracture. No aggressive appearing focal osseous lesion or focal pathologic process. Soft tissues and spinal canal: No prevertebral fluid or swelling. No visible canal hematoma. Upper chest: Unremarkable. Other: None. IMPRESSION: 1. No acute intracranial abnormality. 2. No acute displaced facial fracture. 3. No acute displaced fracture or traumatic listhesis of the cervical spine. Electronically Signed   By: Iven Finn M.D.   On: 01/23/2022 19:22   CT Maxillofacial Wo Contrast  Result Date: 01/23/2022 CLINICAL DATA:  Delirium ams, etoh, fall; Facial trauma, blunt fall, etoh, ams; Neck trauma, dangerous injury mechanism (Age 74-64y) etoh, amd, facial injury EXAM: CT HEAD WITHOUT CONTRAST CT MAXILLOFACIAL WITHOUT CONTRAST CT CERVICAL SPINE WITHOUT CONTRAST TECHNIQUE: Multidetector CT imaging of the head, cervical spine, and maxillofacial structures were performed using the standard protocol  without intravenous contrast. Multiplanar CT image reconstructions of the cervical spine and maxillofacial structures were also generated. RADIATION DOSE REDUCTION: This exam was performed according to the departmental dose-optimization program which includes automated exposure control, adjustment of the mA and/or kV according to patient size and/or use of iterative reconstruction technique. COMPARISON:  None Available. FINDINGS: CT HEAD FINDINGS BRAIN: BRAIN Patchy and confluent areas of decreased attenuation are noted throughout the deep and periventricular white matter of the cerebral hemispheres bilaterally, compatible with chronic microvascular ischemic disease. No evidence of large-territorial acute infarction. No parenchymal hemorrhage. No mass lesion. No extra-axial collection. No mass effect or midline shift. No hydrocephalus. Basilar cisterns are patent. Vascular: No hyperdense vessel. Atherosclerotic calcifications are present within the cavernous internal carotid arteries. Skull: No acute fracture or focal lesion. Other: None. CT MAXILLOFACIAL FINDINGS Osseous: No fracture or mandibular dislocation. No destructive process. Sinuses/Orbits: Paranasal sinuses and mastoid air cells are clear. The orbits are unremarkable. Soft tissues: Negative. CT CERVICAL SPINE FINDINGS Alignment: Normal. Skull base and vertebrae: Mild multilevel degenerative changes of the spine. No acute fracture. No aggressive appearing focal osseous lesion or focal pathologic process. Soft tissues and spinal canal: No prevertebral fluid or swelling. No visible canal hematoma. Upper chest: Unremarkable. Other: None. IMPRESSION: 1. No acute intracranial abnormality. 2. No acute displaced facial fracture. 3. No acute displaced fracture or traumatic listhesis of the cervical spine. Electronically Signed   By: Iven Finn M.D.   On: 01/23/2022 19:22   CT Cervical Spine Wo Contrast  Result Date: 01/23/2022 CLINICAL DATA:  Delirium  ams, etoh, fall; Facial trauma, blunt fall, etoh, ams; Neck trauma, dangerous injury mechanism (Age 74-64y) etoh, amd, facial injury EXAM: CT HEAD WITHOUT CONTRAST CT MAXILLOFACIAL WITHOUT CONTRAST CT CERVICAL SPINE WITHOUT CONTRAST TECHNIQUE: Multidetector CT imaging of the head, cervical spine, and maxillofacial structures were performed using the standard protocol without intravenous contrast. Multiplanar CT image reconstructions of the cervical spine and maxillofacial structures were also generated. RADIATION DOSE REDUCTION: This exam was performed according to the departmental dose-optimization program which includes automated exposure control, adjustment of the mA and/or kV according to patient size and/or use of iterative reconstruction technique. COMPARISON:  None Available. FINDINGS: CT HEAD FINDINGS BRAIN: BRAIN Patchy and confluent areas of decreased attenuation are noted throughout the deep and periventricular white matter of the cerebral hemispheres bilaterally, compatible with chronic microvascular ischemic disease. No evidence of large-territorial acute infarction. No parenchymal hemorrhage. No mass lesion. No extra-axial collection. No mass effect or midline shift. No hydrocephalus. Basilar cisterns are patent. Vascular: No hyperdense vessel. Atherosclerotic calcifications are present within the cavernous internal  carotid arteries. Skull: No acute fracture or focal lesion. Other: None. CT MAXILLOFACIAL FINDINGS Osseous: No fracture or mandibular dislocation. No destructive process. Sinuses/Orbits: Paranasal sinuses and mastoid air cells are clear. The orbits are unremarkable. Soft tissues: Negative. CT CERVICAL SPINE FINDINGS Alignment: Normal. Skull base and vertebrae: Mild multilevel degenerative changes of the spine. No acute fracture. No aggressive appearing focal osseous lesion or focal pathologic process. Soft tissues and spinal canal: No prevertebral fluid or swelling. No visible canal  hematoma. Upper chest: Unremarkable. Other: None. IMPRESSION: 1. No acute intracranial abnormality. 2. No acute displaced facial fracture. 3. No acute displaced fracture or traumatic listhesis of the cervical spine. Electronically Signed   By: Iven Finn M.D.   On: 01/23/2022 19:22   DG Pelvis 1-2 Views  Result Date: 01/23/2022 CLINICAL DATA:  Fall exiting car today. EXAM: PELVIS - 1-2 VIEW COMPARISON:  None Available. FINDINGS: There is no evidence of pelvic fracture or diastasis. No pelvic bone lesions are seen. IMPRESSION: Negative. Electronically Signed   By: Van Clines M.D.   On: 01/23/2022 16:00   DG Chest 2 View  Result Date: 01/23/2022 CLINICAL DATA:  Per triage notes: EMS with ETOH intoxication. Pt recently lost his brother, homeless. Recent fall getting out of car today. Refused ccollar for EMS. fall, etoh EXAM: CHEST - 2 VIEW COMPARISON:  None Available. FINDINGS: Normal mediastinum and cardiac silhouette. Normal pulmonary vasculature. No evidence of effusion, infiltrate, or pneumothorax. No acute bony abnormality. IMPRESSION: No acute cardiopulmonary process. Electronically Signed   By: Suzy Bouchard M.D.   On: 01/23/2022 15:58    Procedures Procedures    Medications Ordered in ED Medications  iohexol (OMNIPAQUE) 350 MG/ML injection 75 mL (75 mLs Intravenous Contrast Given 01/23/22 1906)   ED Course/ Medical Decision Making/ A&P    56 year old here for evaluation of possible intoxication.  Apparently had tripped and fallen out of a nonmoving vehicle.  He has some blood from his mouth however is not very cooperative, smells of alcohol.  He moves all 4 extremities without difficulty.  Has no obvious focal deficits on exam however does appear intoxicated.  Smells of alcohol.  Given blood from his mouth, unreliable historian we will get trauma scans, labs  Labs and imaging personally viewed and interpreted:  CBC without leukocytosis, hemoglobin 82.9 Metabolic panel  elevated LFTs, normal T. bili, alk phos, suspect likely due to chronic Ethanol 422 CT head without significant abnormality CT max face, cervical without significant abnormality CT chest abdomen pelvis without acute abnormality  Patient reassessed.  He was allowed to sober.  He is tolerating p.o. intake.  Denies any SI, HI, AVH.  He is ambulatory here without difficulty.  The patient has been appropriately medically screened and/or stabilized in the ED. I have low suspicion for any other emergent medical condition which would require further screening, evaluation or treatment in the ED or require inpatient management.  Patient is hemodynamically stable and in no acute distress.  Patient able to ambulate in department prior to ED.  Evaluation does not show acute pathology that would require ongoing or additional emergent interventions while in the emergency department or further inpatient treatment.  I have discussed the diagnosis with the patient and answered all questions.  Pain is been managed while in the emergency department and patient has no further complaints prior to discharge.  Patient is comfortable with plan discussed in room and is stable for discharge at this time.  I have discussed strict return precautions for  returning to the emergency department.  Patient was encouraged to follow-up with PCP/specialist refer to at discharge.                            Medical Decision Making Amount and/or Complexity of Data Reviewed Independent Historian: EMS External Data Reviewed: labs, radiology and notes. Labs: ordered. Decision-making details documented in ED Course. Radiology: ordered and independent interpretation performed. Decision-making details documented in ED Course.  Risk OTC drugs. Prescription drug management. Parenteral controlled substances. Decision regarding hospitalization. Diagnosis or treatment significantly limited by social determinants of  health.          Final Clinical Impression(s) / ED Diagnoses Final diagnoses:  Alcoholic intoxication without complication [facility])    Rx / DC Orders ED Discharge Orders     None         Tsion Inghram A, PA-C 01/23/22 2207    Elgie Congo, MD 01/24/22 1224

## 2022-01-23 NOTE — Discharge Instructions (Signed)
We have given you resources for outpatient follow-up and housing.

## 2022-01-23 NOTE — ED Notes (Signed)
ETOH 422. Dr. Elpidio Anis  aware.

## 2022-01-23 NOTE — ED Triage Notes (Signed)
Pt arrives via EMS with ETOH intoxication. Pt recently lost his brother, homeless. Recent fall getting out of car today. Refused ccollar for EMS.

## 2022-01-23 NOTE — ED Notes (Signed)
Pt requested to use bathroom, RN wheeled pt to bathroom. Pt then said to take him back to his bed so he can sleep.

## 2022-02-19 ENCOUNTER — Other Ambulatory Visit: Payer: Self-pay | Admitting: Critical Care Medicine

## 2022-02-19 ENCOUNTER — Encounter: Payer: Self-pay | Admitting: Physician Assistant

## 2022-02-19 ENCOUNTER — Other Ambulatory Visit (HOSPITAL_COMMUNITY): Payer: Self-pay

## 2022-02-19 MED ORDER — AMOXICILLIN-POT CLAVULANATE 875-125 MG PO TABS
1.0000 | ORAL_TABLET | Freq: Two times a day (BID) | ORAL | 0 refills | Status: DC
  Filled 2022-02-19: qty 20, 10d supply, fill #0

## 2022-02-19 MED ORDER — AMLODIPINE BESYLATE 10 MG PO TABS
10.0000 mg | ORAL_TABLET | Freq: Every day | ORAL | 1 refills | Status: DC
  Filled 2022-02-19: qty 30, 30d supply, fill #0

## 2022-02-19 NOTE — Progress Notes (Signed)
His first day in the shelter. Previously, he was in [place], moved here with his sister after his brother died.   Was in the ER 02/01/2022 for detox, cut back after that and is not drinking in the last 2 days.  +tob use, no drug use. Cough, worse in the morning, productive of white sputum.   Father died MI, in his 51s, mother is living.   Pt is here today for a bump behind his ear. Has been getting them on his torso mainly. The ones behind his R ear have some swelling, and needs ABX.   They itch, they come all year, wax and wane but never go away. He will pop them at times and get white material and blood out. No fevers or chills.   Today's Vitals   02/19/22 1638  BP: (!) 195/92  Pulse: 81  SpO2: 97%   There is no height or weight on file to calculate BMI.  He will get Augmentin for the skin infection, amlodipine for BP.  Theodore Demark, PA-C 02/19/2022 4:50 PM

## 2022-02-20 ENCOUNTER — Other Ambulatory Visit (HOSPITAL_COMMUNITY): Payer: Self-pay

## 2022-02-20 NOTE — Congregational Nurse Program (Signed)
  Dept: [phone]   Congregational Nurse Program Note  Date of Encounter: 02/20/2022  Clinic visit for blood pressure check and to review new medications picked up from pharmacy today.  Educated on taking Amoxicillin and Amlodipine per the prescription.  BP lower today 161/85, pulse 85, weight 127 Lbs, O2 Sat 98%. Past Medical History: Past Medical History:  Diagnosis Date   EtOH dependence [facility])     Encounter Details:  CNP Questionnaire - 02/20/22 1630       Questionnaire   Ask client: Do you give verbal consent for me to treat you today? Yes    Student Assistance N/A    Location Patient Served  [facility]    Visit Setting with Client Organization    Patient Status Unhoused    Insurance Uninsured ([employer]s/Self-Pay/Medicaid Family Planning)    Insurance/Financial Assistance Referral Medicaid    Medication Provided Medication Assistance    Medical Provider Yes    Screening Referrals Made N/A    Medical Referrals Made N/A    Medical Appointment Made N/A    Recently w/o PCP, now 1st time PCP visit completed due to CNs referral or appointment made N/A    Food Have Food Insecurities    Transportation Need transportation assistance    Housing/Utilities No permanent housing    Interpersonal Safety Do not feel safe at current residence    Interventions Counsel;Educate;Advocate/Support;Reviewed Medications    Abnormal to Normal Screening Since Last CN Visit N/A    Screenings CN Performed Blood Pressure;Weight;Pulse Ox    Sent Client to Lab for: N/A    Did client attend any of the following based off CNs referral or appointments made? N/A    ED Visit Averted N/A    Life-Saving Intervention Made N/A

## 2022-03-19 ENCOUNTER — Other Ambulatory Visit: Payer: Self-pay

## 2022-03-19 ENCOUNTER — Encounter: Payer: Self-pay | Admitting: Physician Assistant

## 2022-03-19 ENCOUNTER — Other Ambulatory Visit: Payer: Self-pay | Admitting: Critical Care Medicine

## 2022-03-19 MED ORDER — AMLODIPINE BESYLATE 10 MG PO TABS
10.0000 mg | ORAL_TABLET | Freq: Every day | ORAL | 1 refills | Status: DC
  Filled 2022-03-19: qty 30, 30d supply, fill #0

## 2022-03-19 MED ORDER — HYDROXYZINE HCL 10 MG PO TABS
10.0000 mg | ORAL_TABLET | Freq: Three times a day (TID) | ORAL | 0 refills | Status: DC | PRN
  Filled 2022-03-19: qty 30, 10d supply, fill #0

## 2022-03-19 NOTE — Progress Notes (Signed)
Pt seen by Dr Joya Gaskins.  He has more itching, but the bump behind his ear has improved.   Ear lesion has resolved. Itching is bad on his back and chest. Noticeable irritated follicles those areas, lesser lesions on legs. Will add OTC cream such as dermasil, and will add atarax 10 mg tid prn.   Has completed ABX, is almost out of amlodipine 10 mg qd. Continue the amlodipine, refill rx. Get labs at the clinic and then decide on what to add next.   Vitals:   03/19/22 1533  BP: (!) 175/87  Pulse: 78  SpO2: 99%   Lugs clear, heart RRR, irritated follicular lesions as above.   He was given written address to get to the f/u appt.   Ms Freda Munro will deliver the meds tomorrow.  [name], PA-C 03/19/2022 3:45 PM

## 2022-03-20 ENCOUNTER — Other Ambulatory Visit (HOSPITAL_COMMUNITY): Payer: Self-pay

## 2022-03-20 ENCOUNTER — Other Ambulatory Visit: Payer: Self-pay

## 2022-03-20 ENCOUNTER — Other Ambulatory Visit: Payer: Self-pay | Admitting: Critical Care Medicine

## 2022-03-20 MED ORDER — HYDROXYZINE HCL 10 MG PO TABS
10.0000 mg | ORAL_TABLET | Freq: Three times a day (TID) | ORAL | 0 refills | Status: DC | PRN
  Filled 2022-03-20: qty 30, 10d supply, fill #0

## 2022-03-20 MED ORDER — AMLODIPINE BESYLATE 10 MG PO TABS
10.0000 mg | ORAL_TABLET | Freq: Every day | ORAL | 1 refills | Status: DC
  Filled 2022-03-20: qty 30, 30d supply, fill #0

## 2022-03-20 NOTE — Progress Notes (Signed)
Med re-routed

## 2022-03-21 ENCOUNTER — Other Ambulatory Visit (HOSPITAL_COMMUNITY): Payer: Self-pay

## 2022-04-29 NOTE — Congregational Nurse Program (Signed)
  Dept: [phone]   Congregational Nurse Program Note  Date of Encounter: 04/29/2022  Clinic visit to check blood pressure, states he has been out of medication for about 3 days.  BP 176/80, pulse 88 and regular, O2 Sat 96%, weight 135 lbs.  Did not have prescription medication bottle with him to call in refill for Amlodipine. He called his sister who has the bottle. Gave information to his sister on how to call in for refill. Past Medical History: Past Medical History:  Diagnosis Date   EtOH dependence [facility])     Encounter Details:  CNP Questionnaire - 04/29/22 1033       Questionnaire   Ask client: Do you give verbal consent for me to treat you today? Yes    Student Assistance N/A    Location Patient [facility]    Visit Setting with Client Organization    Patient Status Unhoused    Insurance Uninsured ([employer] Card/Care Connects/Self-Pay/Medicaid Family Planning)    Insurance/Financial Assistance Referral Medicaid    Medication Provided Medication Assistance    Medical Provider Yes    Screening Referrals Made N/A    Medical Referrals Made N/A    Medical Appointment Made N/A    Recently w/o PCP, now 1st time PCP visit completed due to CNs referral or appointment made N/A    Food Have Food Insecurities    Transportation Need transportation assistance    Housing/Utilities No permanent housing    Interpersonal Safety Do not feel safe at current residence    Interventions Counsel;Educate;Advocate/Support;Reviewed Medications    Abnormal to Normal Screening Since Last CN Visit N/A    Screenings CN Performed Blood Pressure;Pulse Ox;Weight    Sent Client to Lab for: N/A    Did client attend any of the following based off CNs referral or appointments made? N/A    ED Visit Averted N/A    Life-Saving Intervention Made N/A

## 2022-04-30 ENCOUNTER — Encounter: Payer: Self-pay | Admitting: Physician Assistant

## 2022-04-30 ENCOUNTER — Other Ambulatory Visit: Payer: Self-pay | Admitting: Critical Care Medicine

## 2022-04-30 ENCOUNTER — Other Ambulatory Visit (HOSPITAL_COMMUNITY): Payer: Self-pay

## 2022-04-30 MED ORDER — AMLODIPINE BESYLATE 10 MG PO TABS
10.0000 mg | ORAL_TABLET | Freq: Every day | ORAL | 1 refills | Status: DC
  Filled 2022-04-30: qty 30, 30d supply, fill #0
  Filled 2022-05-29: qty 30, 30d supply, fill #1

## 2022-04-30 NOTE — Progress Notes (Signed)
Med refill

## 2022-04-30 NOTE — Progress Notes (Signed)
Pt seen by Dr Joya Gaskins.   He has been on amlodipine, but has run out 4 days ago. It will be refilled, Cathy to pick up tomorrow.  Pt drinking less, 2-3 beers a week.  Still smokes a little.   No insurance.      04/29/2022   10:39 AM 03/19/2022    3:33 PM 02/20/2022    4:30 PM  Vitals with BMI  Weight 135 lbs  [id number] lbs  Systolic [id number]  Diastolic 80 87 82  Pulse 88 78 85   He will be seen at the clinic soon, do not want him to wait till 04/15.  Lungs are clear, skin has greatly improved.     [name], PA-C 04/30/2022 4:07 PM

## 2022-05-01 ENCOUNTER — Other Ambulatory Visit (HOSPITAL_COMMUNITY): Payer: Self-pay

## 2022-05-01 NOTE — Congregational Nurse Program (Signed)
  Dept: [phone]   Congregational Nurse Program Note  Date of Encounter: 05/01/2022  Follow-up clinic to recheck blood pressure, BP average of 3 checks was 163/73, pulse 70 and regular, no headaches but some dizziness past two days. BP medicine prescribed per Dr Joya Gaskins will be available this afternoon. Past Medical History: Past Medical History:  Diagnosis Date   EtOH dependence [facility])     Encounter Details:  CNP Questionnaire - 05/01/22 1040       Questionnaire   Ask client: Do you give verbal consent for me to treat you today? Yes    Student Assistance [employer] Nurse    Location Patient [facility]    Visit Setting with Client Organization    Patient Status Unhoused    Insurance Uninsured ([employer]/Care Connects/Self-Pay/Medicaid Family Planning)    Insurance/Financial Assistance Referral N/A    Medication Provided Medication Assistance    Medical Provider Yes    Screening Referrals Made N/A    Medical Referrals Made N/A    Medical Appointment Made N/A    Recently w/o PCP, now 1st time PCP visit completed due to CNs referral or appointment made N/A    Food Have Food Insecurities    Transportation Need transportation assistance    Housing/Utilities No permanent housing    Interpersonal Safety Do not feel safe at current residence    Interventions Counsel;Educate;Advocate/Support    Abnormal to Normal Screening Since Last CN Visit N/A    Screenings CN Performed Blood Pressure;Pulse Ox;Temperature    Sent Client to Lab for: N/A    Did client attend any of the following based off CNs referral or appointments made? N/A    ED Visit Averted N/A    Life-Saving Intervention Made N/A

## 2022-05-29 ENCOUNTER — Other Ambulatory Visit (HOSPITAL_COMMUNITY): Payer: Self-pay

## 2022-05-30 ENCOUNTER — Other Ambulatory Visit (HOSPITAL_COMMUNITY): Payer: Self-pay

## 2022-06-05 ENCOUNTER — Other Ambulatory Visit: Payer: Self-pay

## 2022-06-05 ENCOUNTER — Ambulatory Visit: Payer: Medicaid Other | Attending: Family Medicine | Admitting: Critical Care Medicine

## 2022-06-05 ENCOUNTER — Encounter: Payer: Self-pay | Admitting: Critical Care Medicine

## 2022-06-05 ENCOUNTER — Other Ambulatory Visit: Payer: Self-pay | Admitting: Pharmacist

## 2022-06-05 VITALS — BP 132/66 | HR 83 | Ht 64.0 in | Wt 141.2 lb

## 2022-06-05 DIAGNOSIS — I10 Essential (primary) hypertension: Secondary | ICD-10-CM | POA: Diagnosis not present

## 2022-06-05 DIAGNOSIS — Z139 Encounter for screening, unspecified: Secondary | ICD-10-CM | POA: Diagnosis not present

## 2022-06-05 DIAGNOSIS — M6281 Muscle weakness (generalized): Secondary | ICD-10-CM | POA: Diagnosis not present

## 2022-06-05 DIAGNOSIS — Z23 Encounter for immunization: Secondary | ICD-10-CM

## 2022-06-05 DIAGNOSIS — K029 Dental caries, unspecified: Secondary | ICD-10-CM | POA: Diagnosis not present

## 2022-06-05 DIAGNOSIS — R251 Tremor, unspecified: Secondary | ICD-10-CM

## 2022-06-05 DIAGNOSIS — L700 Acne vulgaris: Secondary | ICD-10-CM

## 2022-06-05 MED ORDER — HYDROXYZINE HCL 10 MG PO TABS
10.0000 mg | ORAL_TABLET | Freq: Three times a day (TID) | ORAL | 2 refills | Status: DC | PRN
  Filled 2022-06-05: qty 60, 20d supply, fill #0
  Filled 2022-09-08: qty 60, 20d supply, fill #1
  Filled 2022-11-28 – 2022-12-16 (×2): qty 60, 20d supply, fill #2

## 2022-06-05 MED ORDER — CLINDAMYCIN PHOS-BENZOYL PEROX 1.2-5 % EX GEL
CUTANEOUS | 0 refills | Status: DC
  Filled 2022-06-05: qty 45, 30d supply, fill #0

## 2022-06-05 MED ORDER — AMLODIPINE BESYLATE 10 MG PO TABS
10.0000 mg | ORAL_TABLET | Freq: Every day | ORAL | 2 refills | Status: DC
  Filled 2022-06-05 – 2022-06-30 (×2): qty 90, 90d supply, fill #0
  Filled 2022-09-08: qty 90, 90d supply, fill #1
  Filled 2022-11-28 – 2022-12-16 (×2): qty 90, 90d supply, fill #2

## 2022-06-05 MED ORDER — CLINDAMYCIN PHOS-BENZOYL PEROX 1-5 % EX GEL
Freq: Two times a day (BID) | CUTANEOUS | 0 refills | Status: DC
  Filled 2022-06-05: qty 25, fill #0

## 2022-06-05 MED ORDER — DOXEPIN HCL 150 MG PO CAPS
150.0000 mg | ORAL_CAPSULE | Freq: Every day | ORAL | 2 refills | Status: DC
  Filled 2022-06-05: qty 60, 60d supply, fill #0
  Filled 2022-11-28 – 2022-12-16 (×2): qty 60, 60d supply, fill #1

## 2022-06-05 MED ORDER — FLUTICASONE PROPIONATE 50 MCG/ACT NA SUSP
2.0000 | Freq: Every day | NASAL | 6 refills | Status: AC
  Filled 2022-06-05: qty 16, 30d supply, fill #0
  Filled 2022-09-08: qty 16, 30d supply, fill #1
  Filled 2022-11-28 – 2022-12-16 (×2): qty 16, 30d supply, fill #2

## 2022-06-05 NOTE — Patient Instructions (Signed)
Apply cream given for face as prescribed Continue amlodipine Start doxepin daily at bedtime for itching Dental referral given  Return Dr Joya Gaskins 4 months

## 2022-06-05 NOTE — Assessment & Plan Note (Signed)
As per tremor
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
## 2022-06-05 NOTE — Assessment & Plan Note (Signed)
Apply topical benzyl peroxide

## 2022-06-05 NOTE — Assessment & Plan Note (Signed)
Referral made to dentistry

## 2022-06-05 NOTE — Assessment & Plan Note (Signed)
Primary hypertension currently under good control we will continue low-dose amlodipine check metabolic panel

## 2022-06-05 NOTE — Progress Notes (Signed)
No concerns. 

## 2022-06-05 NOTE — Assessment & Plan Note (Signed)
Will check magnesium and phosphorus levels

## 2022-06-06 LAB — COMPREHENSIVE METABOLIC PANEL
ALT: 65 IU/L — ABNORMAL HIGH (ref 0–44)
AST: 83 IU/L — ABNORMAL HIGH (ref 0–40)
Albumin/Globulin Ratio: 0.7 — ABNORMAL LOW (ref 1.2–2.2)
Albumin: 3.3 g/dL — ABNORMAL LOW (ref 3.8–4.9)
Alkaline Phosphatase: 137 IU/L — ABNORMAL HIGH (ref 44–121)
BUN/Creatinine Ratio: 15 (ref 9–20)
BUN: 12 mg/dL (ref 6–24)
Bilirubin Total: 1.4 mg/dL — ABNORMAL HIGH (ref 0.0–1.2)
CO2: 24 mmol/L (ref 20–29)
Calcium: 9 mg/dL (ref 8.7–10.2)
Chloride: 101 mmol/L (ref 96–106)
Creatinine, Ser: 0.81 mg/dL (ref 0.76–1.27)
Globulin, Total: 5 g/dL — ABNORMAL HIGH (ref 1.5–4.5)
Glucose: 88 mg/dL (ref 70–99)
Potassium: 4.2 mmol/L (ref 3.5–5.2)
Sodium: 138 mmol/L (ref 134–144)
Total Protein: 8.3 g/dL (ref 6.0–8.5)
eGFR: 102 mL/min/{1.73_m2} (ref >59)

## 2022-06-06 LAB — CBC WITH DIFFERENTIAL/PLATELET
Basophils Absolute: 0.1 10*3/uL (ref 0.0–0.2)
Basos: 1 %
EOS (ABSOLUTE): 0.3 10*3/uL (ref 0.0–0.4)
Eos: 4 %
Hematocrit: 33.7 % — ABNORMAL LOW (ref 37.5–51.0)
Hemoglobin: 12.6 g/dL — ABNORMAL LOW (ref 13.0–17.7)
Immature Grans (Abs): 0 10*3/uL (ref 0.0–0.1)
Immature Granulocytes: 0 %
Lymphocytes Absolute: 2.7 10*3/uL (ref 0.7–3.1)
Lymphs: 37 %
MCH: 35.7 pg — ABNORMAL HIGH (ref 26.6–33.0)
MCHC: 37.4 g/dL — ABNORMAL HIGH (ref 31.5–35.7)
MCV: 96 fL (ref 79–97)
Monocytes Absolute: 1.1 10*3/uL — ABNORMAL HIGH (ref 0.1–0.9)
Monocytes: 15 %
Neutrophils Absolute: 3.1 10*3/uL (ref 1.4–7.0)
Neutrophils: 43 %
Platelets: 83 10*3/uL — CL (ref 150–450)
RBC: 3.53 x10E6/uL — ABNORMAL LOW (ref 4.14–5.80)
RDW: 11.1 % — ABNORMAL LOW (ref 11.6–15.4)
WBC: 7.2 10*3/uL (ref 3.4–10.8)

## 2022-06-06 LAB — HEMOGLOBIN A1C
Est. average glucose Bld gHb Est-mCnc: 97 mg/dL
Hgb A1c MFr Bld: 5 % (ref 4.8–5.6)

## 2022-06-06 LAB — MAGNESIUM: Magnesium: 1.9 mg/dL (ref 1.6–2.3)

## 2022-06-06 LAB — PHOSPHORUS: Phosphorus: 3.4 mg/dL (ref 2.8–4.1)

## 2022-06-10 ENCOUNTER — Encounter: Payer: Self-pay | Admitting: Critical Care Medicine

## 2022-06-10 DIAGNOSIS — D693 Immune thrombocytopenic purpura: Secondary | ICD-10-CM | POA: Insufficient documentation

## 2022-06-10 DIAGNOSIS — R7989 Other specified abnormal findings of blood chemistry: Secondary | ICD-10-CM | POA: Insufficient documentation

## 2022-06-10 NOTE — Progress Notes (Signed)
Pt aware of results 

## 2022-06-11 ENCOUNTER — Other Ambulatory Visit: Payer: Self-pay

## 2022-06-11 ENCOUNTER — Encounter: Payer: Self-pay | Admitting: Physician Assistant

## 2022-06-11 ENCOUNTER — Other Ambulatory Visit: Payer: Self-pay | Admitting: Critical Care Medicine

## 2022-06-11 MED ORDER — VALSARTAN-HYDROCHLOROTHIAZIDE 160-25 MG PO TABS
1.0000 | ORAL_TABLET | Freq: Every day | ORAL | 3 refills | Status: DC
  Filled 2022-06-11: qty 90, 90d supply, fill #0
  Filled 2022-06-12: qty 30, 30d supply, fill #0
  Filled 2022-09-08: qty 30, 30d supply, fill #1
  Filled 2022-11-28 – 2022-12-16 (×2): qty 30, 30d supply, fill #2

## 2022-06-11 NOTE — Progress Notes (Signed)
BP MED

## 2022-06-11 NOTE — Progress Notes (Signed)
Pt seen by Dr Joya Gaskins.  He had been to the office 03/28  and had labs. These were reviewed w/ pt.      Latest Ref Rng & Units 06/05/2022   10:12 AM 01/23/2022    4:54 PM 01/23/2022    3:29 PM  CBC  WBC 3.4 - 10.8 x10E3/uL 7.2   7.0   Hemoglobin 13.0 - 17.7 g/dL 12.6  12.6  12.3   Hematocrit 37.5 - 51.0 % 33.7  37.0  35.0   Platelets 150 - 450 x10E3/uL 83   83        Latest Ref Rng & Units 06/05/2022   10:12 AM 01/23/2022    4:54 PM 01/23/2022    3:29 PM  CMP  Glucose 70 - 99 mg/dL 88  122  122   BUN 6 - 24 mg/dL 12  5  6    Creatinine 0.76 - 1.27 mg/dL 0.81  1.20  0.68   Sodium 134 - 144 mmol/L 138  135  133   Potassium 3.5 - 5.2 mmol/L 4.2  3.7  3.5   Chloride 96 - 106 mmol/L 101  99  100   CO2 20 - 29 mmol/L 24   21   Calcium 8.7 - 10.2 mg/dL 9.0   8.5   Total Protein 6.0 - 8.5 g/dL 8.3   7.6   Total Bilirubin 0.0 - 1.2 mg/dL 1.4   0.9   Alkaline Phos 44 - 121 IU/L 137   81   AST 0 - 40 IU/L 83   76   ALT 0 - 44 IU/L 65   58     His platelets have been low for several years. LFTs abnl, but not too bad.   This is felt 2nd ETOH use.  Pt states he has not been drinking since 01/2022 at the time of his last ER visit.   BP is elevated today, but was 132/66 in the office. However, all other BP readings are elevated. Pt started on valsartan/HCTZ 160/25.   Pt rec'd tetanus shot in the office 03/28, upper R arm. He has a 5 cm area of erythema w/ mild edema. Was given ibuprofen for this.   [name], PA-C 06/11/2022 2:26 PM,M

## 2022-06-12 ENCOUNTER — Other Ambulatory Visit: Payer: Self-pay

## 2022-06-23 ENCOUNTER — Ambulatory Visit: Payer: Self-pay | Admitting: Family Medicine

## 2022-06-30 ENCOUNTER — Other Ambulatory Visit: Payer: Self-pay

## 2022-07-01 ENCOUNTER — Other Ambulatory Visit: Payer: Self-pay

## 2022-09-08 ENCOUNTER — Other Ambulatory Visit: Payer: Self-pay

## 2022-09-08 ENCOUNTER — Other Ambulatory Visit: Payer: Self-pay | Admitting: Critical Care Medicine

## 2022-09-08 MED ORDER — CLINDAMYCIN PHOS-BENZOYL PEROX 1.2-5 % EX GEL
CUTANEOUS | 0 refills | Status: DC
  Filled 2022-09-08: qty 45, 30d supply, fill #0

## 2022-09-09 ENCOUNTER — Other Ambulatory Visit: Payer: Self-pay

## 2022-10-08 ENCOUNTER — Ambulatory Visit: Payer: Medicaid Other | Admitting: Critical Care Medicine

## 2022-10-15 ENCOUNTER — Ambulatory Visit: Payer: Medicaid Other | Admitting: Critical Care Medicine
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
## 2022-11-28 ENCOUNTER — Other Ambulatory Visit: Payer: Self-pay | Admitting: Critical Care Medicine

## 2022-11-28 ENCOUNTER — Other Ambulatory Visit: Payer: Self-pay

## 2022-12-02 ENCOUNTER — Other Ambulatory Visit: Payer: Self-pay

## 2022-12-08 ENCOUNTER — Other Ambulatory Visit: Payer: Self-pay

## 2022-12-16 ENCOUNTER — Encounter: Payer: Medicaid Other | Admitting: *Deleted

## 2022-12-16 ENCOUNTER — Other Ambulatory Visit: Payer: Self-pay

## 2022-12-16 VITALS — BP 129/75 | HR 65 | Temp 98.4°F | Resp 18 | Ht 64.0 in | Wt 138.0 lb

## 2022-12-16 DIAGNOSIS — Z006 Encounter for examination for normal comparison and control in clinical research program: Secondary | ICD-10-CM

## 2022-12-16 NOTE — Progress Notes (Signed)
Danny Carlson is here for enrollment in [facility].  He has a history of some ETOH use, and also thrombocytopenia/elevated LFTs.  He has no known CAD and has been seeing Dr. Delford Field.  He has treated hypertension.    Subject met inclusion and exclusion criteria.  The informed consent form, study requirements and expectations were reviewed with the subject and questions and concerns were addressed prior to the signing of the consent form.  The subject verbalized understanding of the trial requirements.  The subject agreed to participate in the [facility]  trial and signed the informed consent at 11:46 AM  on 12-16-2022.  The informed consent was obtained prior to performance of any protocol-specific procedures for the subject.  A copy of the signed informed consent was given to the subject and a copy was placed in the subject's medical record.    Seychelles Chalmers, Research Coordinator  BP 129/75 (BP Location: Left Arm)   Pulse 65   Temp 98.4 F (36.9 C)   Resp 18   Ht 5\' 4"  (1.626 m)   Wt 138 lb (62.6 kg)   SpO2 100%   Alert, oriented male in NAD No JVD No carotid bruits. Lungs clear Cor reg Abdomen soft Ext no edema   Patient is enrolled in [facility] as control.    Arturo Morton. Riley Kill, MD Medical Director, [facility]

## 2022-12-16 NOTE — Research (Addendum)
AA HEART  Informed Consent   Subject Name: Danny Carlson  Subject met inclusion and exclusion criteria.  The informed consent form, study requirements and expectations were reviewed with the subject and questions and concerns were addressed prior to the signing of the consent form.  The subject verbalized understanding of the trial requirements.  The subject agreed to participate in the AA HEART  trial and signed the informed consent at 11:46 AM  on 12-16-2022.  The informed consent was obtained prior to performance of any protocol-specific procedures for the subject.  A copy of the signed informed consent was given to the subject and a copy was placed in the subject's medical record.   [place] Belvie Iribe, Research Coordinator    Are there any labs that are clinically significant?  Yes []  OR No[]      ACCESSION NO. [id number]                                             Page 1 of 1                                                        INVESTIGATOR: ([id number])                          PROTOCOL   [id number]                     Thomasene Ripple, M.D.                              INVESTIGATOR NO.: [id number]                     c/o [facility]                         SUBJECT NUMBER: [id number]                     [place]                 SUBJECT INITIALS NOT COLLECTED:                     [address]                          VISIT: D0                     [place], [place] 0                   SPONSOR REPORT TO:                 COLLECTION TIME:12:18 DATE:16-Dec-2022                     Cruzita Lederer                 DATE RECEIVED IN LABORATORY: 17-Dec-2022  c/o Sponsor(or Addtl) [facility].Study DATE REPORTED BY LABORATORY: 17-Dec-2022                     [facility]                          SEX: M  AGE: [id number]                     [id number]                   [place], [place] [phone]                                                                                         Ref. Ranges               Clinical    Comments                                                                          Significance                                                                            Yes*  No                    HEMATOLOGY&DIFFERENTIAL PANEL                      HGB            14.1         12.5-17.0 g/dL                      HCT            42           37-51 %                      RBC            4.0          4.0-5.8 x106/uL                      MCH            35      H    26-34 pg                      MCHC  33           31-38 g/dL                      RDW            13.5         12.0-15.0 %                      RBC Morph      No Review Required                        MCV            106     H    80-100 fL                      WBC            5.24         3.80-10.70 x103/uL                      Neutrophil     2.89         1.96-7.23 x103/uL                      Lymphocyte     1.88         0.80-3.00 x103/uL                      Monocytes      0.39         0.12-0.92 x103/uL                      Eosinophil     0.08         0.00-0.57 x103/uL                      Basophils      0.01         0.00-0.20 x103/uL                      Neutrophil     55.0         40.5-75.0 %                      Lymphocyte     35.7         15.4-48.5%                      Monocytes      7.5          2.6-10.1 %                      Eosinophil     1.5          0.0-6.8 %                      Basophils      0.2          0.0-2.0 %                      Platelets      163          140-400 x103/uL ANC  ANC            2.89         1.96-7.23 x103/uL                    HBA1C                      Fast HbA1c     5.0          <6.5%   RETICULOCYTES                      Retic %        1.5          0.6-2.5 %                      Retic Abs      0.060        0.030-0.130 x106/uL

## 2023-04-07 ENCOUNTER — Ambulatory Visit: Payer: MEDICAID | Admitting: Nurse Practitioner

## 2023-04-07 VITALS — BP 185/96 | HR 78 | Temp 98.3°F | Ht 64.0 in | Wt 134.9 lb

## 2023-04-07 DIAGNOSIS — I10 Essential (primary) hypertension: Secondary | ICD-10-CM

## 2023-04-07 DIAGNOSIS — R600 Localized edema: Secondary | ICD-10-CM

## 2023-04-07 NOTE — Progress Notes (Signed)
Office Visit  Subjective   Patient ID: Danny Carlson   DOB: [birth year]   Age: 58 y.o.   MRN: [id number]   Chief Complaint Bump on back of right ear.     History of Present Illness Bump, behind right ear. Burning, itching sensation. Onset at least 1 month. Pt reports it is smaller now because he is squeezing it regularly. Producing liquid substance.  Last time he was able to squeeze anything out is 3 days ago.  Pt is out of valsartan-hydrochlorothiazide at this time but will refill today.      Past Medical History Past Medical History:  Diagnosis Date  . EtOH dependence (HCC)   . Hypertension      Allergies No Known Allergies   Medications  Current Outpatient Medications:  .  amLODipine (NORVASC) 10 MG tablet, Take 1 tablet (10 mg total) by mouth daily., Disp: 90 tablet, Rfl: 2 .  Clindamycin-Benzoyl Per, Refr, gel, Apply topically 2 (two) times daily., Disp: 45 g, Rfl: 0 .  doxepin (SINEQUAN) 150 MG capsule, Take 1 capsule (150 mg total) by mouth at bedtime., Disp: 60 capsule, Rfl: 2 .  fluticasone (FLONASE) 50 MCG/ACT nasal spray, Place 2 sprays into both nostrils daily., Disp: 16 g, Rfl: 6 .  hydrOXYzine (ATARAX) 10 MG tablet, Take 1 tablet (10 mg total) by mouth 3 (three) times daily as needed for itching., Disp: 60 tablet, Rfl: 2 .  valsartan-hydrochlorothiazide (DIOVAN-HCT) 160-25 MG tablet, Take 1 tablet by mouth daily., Disp: 90 tablet, Rfl: 3   Review of Systems Review of Systems  Constitutional:  Negative for chills, fever and weight loss.  Respiratory:  Positive for shortness of breath.        Pt reports mild shortness of breath with moderate exertion.  Cardiovascular:  Positive for leg swelling.       +3 pitting edema to BLE  Skin:  Positive for itching.       Posteriorly to right ear.  Neurological:  Negative for dizziness, speech change, focal weakness and headaches.       Objective:    Vitals BP (!) 202/101   Pulse 78   Temp 98.3 F (36.8 C)   Ht  5\' 4"  (1.626 m)   Wt 134 lb 14.4 oz (61.2 kg)   BMI 23.16 kg/m    Physical Examination Physical Exam Constitutional:      General: He is not in acute distress.    Appearance: Normal appearance.  Cardiovascular:     Rate and Rhythm: Normal rate and regular rhythm.     Heart sounds: Normal heart sounds.  Pulmonary:     Effort: Pulmonary effort is normal.     Breath sounds: Normal breath sounds.  Abdominal:     General: There is no distension.  Musculoskeletal:     Right lower leg: Edema present.     Left lower leg: Edema present.     Comments: 3+ pitting edema noted LLE. 1+ RLE  Skin:    Capillary Refill: Capillary refill takes less than 2 seconds.  Neurological:     Mental Status: He is alert and oriented to person, place, and time.  Psychiatric:        Mood and Affect: Mood normal.       Assessment & Plan:   Pt to see cardiology 04/08/2023.     Follow up as needed with clinic.  Go to ED for any increased SOB.   Kittie Plater, NP

## 2023-04-08 ENCOUNTER — Encounter: Payer: Self-pay | Admitting: Physician Assistant

## 2023-04-08 ENCOUNTER — Other Ambulatory Visit: Payer: Self-pay

## 2023-04-08 ENCOUNTER — Other Ambulatory Visit: Payer: Self-pay | Admitting: Critical Care Medicine

## 2023-04-08 MED ORDER — CLINDAMYCIN PHOS-BENZOYL PEROX 1.2-5 % EX GEL
CUTANEOUS | 0 refills | Status: AC
  Filled 2023-04-08: qty 45, 30d supply, fill #0

## 2023-04-08 MED ORDER — AMLODIPINE BESYLATE 10 MG PO TABS
10.0000 mg | ORAL_TABLET | Freq: Every day | ORAL | 2 refills | Status: DC
  Filled 2023-04-08: qty 90, 90d supply, fill #0
  Filled 2023-07-06: qty 90, 90d supply, fill #1
  Filled 2023-12-09: qty 90, 90d supply, fill #2

## 2023-04-08 MED ORDER — HYDROXYZINE HCL 10 MG PO TABS
10.0000 mg | ORAL_TABLET | Freq: Three times a day (TID) | ORAL | 2 refills | Status: AC | PRN
  Filled 2023-04-08: qty 60, 20d supply, fill #0
  Filled 2023-10-12: qty 60, 20d supply, fill #1

## 2023-04-08 MED ORDER — DOXYCYCLINE HYCLATE 100 MG PO TABS
100.0000 mg | ORAL_TABLET | Freq: Two times a day (BID) | ORAL | 0 refills | Status: AC
  Filled 2023-04-08: qty 10, 5d supply, fill #0

## 2023-04-08 MED ORDER — VALSARTAN-HYDROCHLOROTHIAZIDE 160-25 MG PO TABS
1.0000 | ORAL_TABLET | Freq: Every day | ORAL | 3 refills | Status: DC
  Filled 2023-04-08: qty 90, 90d supply, fill #0
  Filled 2023-07-06: qty 90, 90d supply, fill #1
  Filled 2023-10-12: qty 90, 90d supply, fill #2
  Filled 2024-01-20 – 2024-02-09 (×2): qty 90, 90d supply, fill #3

## 2023-04-08 MED ORDER — DOXEPIN HCL 150 MG PO CAPS
150.0000 mg | ORAL_CAPSULE | Freq: Every day | ORAL | 2 refills | Status: DC
  Filled 2023-04-08: qty 30, 30d supply, fill #0

## 2023-04-08 NOTE — Progress Notes (Signed)
Pt had been gone for a while, living w/ sister. She then moved. This is his first day back in the clinic.  Behind R ear c/o discomfort and itching. Similar sx on L, but they have resolved. He needs ABX >> oral Doxy sent in.  Pt almost out of his meds. BP has been high lately. All meds renewed.   Legs have been swelling. He is out of the valsartan/ hydrochlorothiazide  Wt Readings from Last 3 Encounters:  04/07/23 134 lb 14.4 oz (61.2 kg)  12/16/22 138 lb (62.6 kg)  06/05/22 141 lb 3.2 oz (64 kg)   Today's Vitals   04/08/23 1556  BP: (!) 142/71  Pulse: 78  SpO2: 96%   There is no height or weight on file to calculate BMI.  Theodore Demark, PA-C 04/08/2023 4:01 PM

## 2023-04-08 NOTE — Progress Notes (Signed)
Med ref   skin infxn meds

## 2023-04-09 ENCOUNTER — Other Ambulatory Visit (HOSPITAL_COMMUNITY): Payer: Self-pay

## 2023-04-09 ENCOUNTER — Other Ambulatory Visit: Payer: Self-pay

## 2023-04-10 ENCOUNTER — Other Ambulatory Visit: Payer: Self-pay

## 2023-04-14 DIAGNOSIS — R600 Localized edema: Secondary | ICD-10-CM | POA: Insufficient documentation

## 2023-04-22 ENCOUNTER — Other Ambulatory Visit: Payer: Self-pay
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
## 2023-05-07 ENCOUNTER — Other Ambulatory Visit: Payer: Self-pay

## 2023-05-07 ENCOUNTER — Ambulatory Visit: Payer: Medicaid Other | Attending: Critical Care Medicine | Admitting: Critical Care Medicine

## 2023-05-07 ENCOUNTER — Encounter: Payer: Self-pay | Admitting: Critical Care Medicine

## 2023-05-07 ENCOUNTER — Telehealth: Payer: Self-pay

## 2023-05-07 VITALS — BP 106/65 | HR 73 | Temp 98.0°F | Wt 131.8 lb

## 2023-05-07 DIAGNOSIS — R7989 Other specified abnormal findings of blood chemistry: Secondary | ICD-10-CM | POA: Diagnosis not present

## 2023-05-07 DIAGNOSIS — Z1159 Encounter for screening for other viral diseases: Secondary | ICD-10-CM | POA: Diagnosis not present

## 2023-05-07 DIAGNOSIS — D693 Immune thrombocytopenic purpura: Secondary | ICD-10-CM

## 2023-05-07 DIAGNOSIS — I10 Essential (primary) hypertension: Secondary | ICD-10-CM | POA: Diagnosis not present

## 2023-05-07 DIAGNOSIS — Z114 Encounter for screening for human immunodeficiency virus [HIV]: Secondary | ICD-10-CM

## 2023-05-07 MED ORDER — DOXEPIN HCL 150 MG PO CAPS
150.0000 mg | ORAL_CAPSULE | Freq: Every evening | ORAL | 2 refills | Status: AC | PRN
  Filled 2023-05-07: qty 60, 60d supply, fill #0

## 2023-05-07 NOTE — Assessment & Plan Note (Signed)
 Reassess liver function

## 2023-05-07 NOTE — Assessment & Plan Note (Signed)
 Platelets have been low for 6 years and stable reassess blood count

## 2023-05-07 NOTE — Telephone Encounter (Addendum)
 At the request of Dr Delford Field, I met with the patient when he was in the clinic today.  He explained that he currently is staying at [facility] and has been there since 1/29/205 and can stay there until 4/29/205.  Prior to [facility], he was living in his sister's car.  He has no income and wants to work but said he is not able to find a job.  He stated he spoke to Maynard, a case worker at [employer] but she has not provided any assistance.  He wants to have his own place to live but understands that he will need to have an income to pay rent/ bills. He currently receives $281/ month food stamps.   He said he has not been to the [facility] but is willing to speak to Erick Blinks, RN/ [facility] about resources that may be available to him.  He has [employer] and said that he is not aware of any benefits with this managed care tailored plan.  I told him that I will check with [employer] with hopes to connect him with a Tailored plan care manager that may be able to assist with job search and more stable, supportive  housing   He was very appreciative of any help that can be offered.

## 2023-05-07 NOTE — Assessment & Plan Note (Signed)
 Hypertension well-controlled continue current medications labs will be obtained

## 2023-05-07 NOTE — Patient Instructions (Signed)
 Medication refills at [facility] pharmacy downstairs for future Labs today  Return 6 months

## 2023-05-08 NOTE — Telephone Encounter (Signed)
 I called [facility] [phone] to inquire if the patient has been assigned a Tailored Care Manager. I spoke to [place] who stated that the patient has not been assigned yet.  She said she will request to have a case manager assigned and they will contact him.  That process could take 3-5 days to be assigned.  I called the patient and informed him of the above noted information from [facility] and also informed him that I sent the message to Erick Blinks, RN/ [facility] requesting she reach out to him. He said he understood and I asked him to call me if he has any questions.

## 2023-05-09 LAB — COMPREHENSIVE METABOLIC PANEL
ALT: 57 IU/L — ABNORMAL HIGH (ref 0–44)
AST: 97 IU/L — ABNORMAL HIGH (ref 0–40)
Albumin: 3.3 g/dL — ABNORMAL LOW (ref 3.8–4.9)
Alkaline Phosphatase: 149 IU/L — ABNORMAL HIGH (ref 44–121)
BUN/Creatinine Ratio: 13 (ref 9–20)
BUN: 13 mg/dL (ref 6–24)
Bilirubin Total: 0.7 mg/dL (ref 0.0–1.2)
CO2: 21 mmol/L (ref 20–29)
Calcium: 8.9 mg/dL (ref 8.7–10.2)
Chloride: 103 mmol/L (ref 96–106)
Creatinine, Ser: 0.97 mg/dL (ref 0.76–1.27)
Globulin, Total: 4.4 g/dL (ref 1.5–4.5)
Glucose: 90 mg/dL (ref 70–99)
Potassium: 5.1 mmol/L (ref 3.5–5.2)
Sodium: 136 mmol/L (ref 134–144)
Total Protein: 7.7 g/dL (ref 6.0–8.5)
eGFR: 90 mL/min/{1.73_m2} (ref >59)

## 2023-05-09 LAB — CBC WITH DIFFERENTIAL/PLATELET
Basophils Absolute: 0.1 10*3/uL (ref 0.0–0.2)
Basos: 1 %
EOS (ABSOLUTE): 0.2 10*3/uL (ref 0.0–0.4)
Eos: 3 %
Hematocrit: 28.8 % — ABNORMAL LOW (ref 37.5–51.0)
Hemoglobin: 10.4 g/dL — ABNORMAL LOW (ref 13.0–17.7)
Immature Grans (Abs): 0 10*3/uL (ref 0.0–0.1)
Immature Granulocytes: 0 %
Lymphocytes Absolute: 2.5 10*3/uL (ref 0.7–3.1)
Lymphs: 38 %
MCH: 34.7 pg — ABNORMAL HIGH (ref 26.6–33.0)
MCHC: 36.1 g/dL — ABNORMAL HIGH (ref 31.5–35.7)
MCV: 96 fL (ref 79–97)
Monocytes Absolute: 0.8 10*3/uL (ref 0.1–0.9)
Monocytes: 12 %
Neutrophils Absolute: 3 10*3/uL (ref 1.4–7.0)
Neutrophils: 46 %
Platelets: 182 10*3/uL (ref 150–450)
RBC: 3 x10E6/uL — ABNORMAL LOW (ref 4.14–5.80)
RDW: 10.7 % — ABNORMAL LOW (ref 11.6–15.4)
WBC: 6.5 10*3/uL (ref 3.4–10.8)

## 2023-05-09 LAB — HCV RT-PCR, QUANT (NON-GRAPH)
HCV log10: 5.912 {Log_IU}/mL
Hepatitis C Quantitation: 816000 [IU]/mL

## 2023-05-09 LAB — HCV AB W REFLEX TO QUANT PCR: HCV Ab: REACTIVE — AB

## 2023-05-09 LAB — HIV ANTIBODY (ROUTINE TESTING W REFLEX): HIV Screen 4th Generation wRfx: NONREACTIVE

## 2023-05-10 ENCOUNTER — Other Ambulatory Visit: Payer: Self-pay | Admitting: Critical Care Medicine

## 2023-05-10 DIAGNOSIS — B182 Chronic viral hepatitis C: Secondary | ICD-10-CM | POA: Insufficient documentation

## 2023-05-10 NOTE — Progress Notes (Signed)
 Let pt know he has active hepatitis C  liver function elevated, I will refer him to hepatitis clinic Hiv neg, rest of labs normal Mason Jim

## 2023-05-11 ENCOUNTER — Telehealth: Payer: Self-pay

## 2023-05-11 NOTE — Telephone Encounter (Signed)
 Pt was called and is aware of results, DOB was confirmed.  ?

## 2023-05-11 NOTE — Telephone Encounter (Signed)
-----   Message from [name] sent at 05/10/2023  4:50 PM EST ----- Let pt know he has active hepatitis C  liver function elevated, I will refer him to hepatitis clinic Hiv neg, rest of labs normal Danny Carlson

## 2023-05-19 NOTE — Congregational Nurse Program (Signed)
 RN called [name] after referral from Laural Benes from [facility] and [facility] to see if he could be helped with extra resources. He needs help with case management for housing and wants to apply for disability and job information. RN states [employer] can assist and appointment made for Monday 05/25/23 at 1000 for case management services.

## 2023-05-20 ENCOUNTER — Telehealth: Payer: Self-pay

## 2023-05-20 ENCOUNTER — Other Ambulatory Visit (HOSPITAL_COMMUNITY): Payer: Self-pay

## 2023-05-20 NOTE — Telephone Encounter (Signed)
.=  Pharmacy Patient Advocate Encounter  Insurance verification completed.   The patient is insured through [employer]  [place]   Ran test claim for [employer]. Currently a quantity of 84 is a 28 day supply and the co-pay is $4.00 .   This test claim was processed through [facility]- copay amounts may vary at other pharmacies due to pharmacy/plan contracts, or as the patient moves through the different stages of their insurance plan.

## 2023-05-20 NOTE — Telephone Encounter (Signed)
 Raven- I see that you are meeting with him 05/25/2023.  Can you please check with him to see if he has been contacted by [employer]?  They are assigning him a caseworker in the community so that person should be an added resource for housing/ community resources    Thanks

## 2023-05-20 NOTE — Telephone Encounter (Signed)
 Noted.

## 2023-05-28 ENCOUNTER — Encounter: Payer: Self-pay | Admitting: Family

## 2023-05-28 ENCOUNTER — Other Ambulatory Visit: Payer: Self-pay

## 2023-05-28 ENCOUNTER — Ambulatory Visit (INDEPENDENT_AMBULATORY_CARE_PROVIDER_SITE_OTHER): Payer: MEDICAID | Admitting: Family

## 2023-05-28 VITALS — BP 118/68 | HR 71 | Temp 97.7°F | Ht 64.0 in | Wt 135.0 lb

## 2023-05-28 DIAGNOSIS — B182 Chronic viral hepatitis C: Secondary | ICD-10-CM

## 2023-05-28 NOTE — Patient Instructions (Signed)
 Nice to see you.  We will check your lab work today.  Continue to take your medication daily as prescribed.  Plan for follow up in 1 after start of medication which will be scheduled by Pharmacy Team once medication is started.   Have a great day and stay safe!  Limit acetaminophen (Tylenol) usage to no more than 2 grams (2,000 mg) per day.  Avoid alcohol.  Do not share toothbrushes or razors.  Practice safe sex to protect against transmission as well as sexually transmitted disease.    Hepatitis C Hepatitis C is a viral infection of the liver. It can lead to scarring of the liver (cirrhosis), liver failure, or liver cancer. Hepatitis C may go undetected for months or years because people with the infection may not have symptoms, or they may have only mild symptoms. What are the causes? This condition is caused by the hepatitis C virus (HCV). The virus can spread from person to person (is contagious) through: Blood. Childbirth. A woman who has hepatitis C can pass it to her baby during birth. Bodily fluids, such as breast milk, tears, semen, vaginal fluids, and saliva. Blood transfusions or organ transplants done in the [place] before 1992.  What increases the risk? The following factors may make you more likely to develop this condition: Having contact with unclean (contaminated) needles or syringes. This may result from: Acupuncture. Tattoing. Body piercing. Injecting drugs. Having unprotected sex with someone who is infected. Needing treatment to filter your blood (kidney dialysis). Having HIV (human immunodeficiency virus) or AIDS (acquired immunodeficiency syndrome). Working in a job that involves contact with blood or bodily fluids, such as health care.  What are the signs or symptoms? Symptoms of this condition include: Fatigue. Loss of appetite. Nausea. Vomiting. Abdominal pain. Dark yellow urine. Yellowish skin and eyes (jaundice). Itchy  skin. Clay-colored bowel movements. Joint pain. Bleeding and bruising easily. Fluid building up in your stomach (ascites).  In some cases, you may not have any symptoms. How is this diagnosed? This condition is diagnosed with: Blood tests. Other tests to check how well your liver is functioning. They may include: Magnetic resonance elastography (MRE). This imaging test uses MRIs and sound waves to measure liver stiffness. Transient elastography. This imaging test uses ultrasounds to measure liver stiffness. Liver biopsy. This test requires taking a small tissue sample from your liver to examine it under a microscope.  How is this treated? Your health care provider may perform noninvasive tests or a liver biopsy to help decide the best course of treatment. Treatment may include: Antiviral medicines and other medicines. Follow-up treatments every 6-12 months for infections or other liver conditions. Receiving a donated liver (liver transplant).  Follow these instructions at home: Medicines Take over-the-counter and prescription medicines only as told by your health care provider. Take your antiviral medicine as told by your health care provider. Do not stop taking the antiviral even if you start to feel better. Do not take any medicines unless approved by your health care provider, including over-the-counter medicines and birth control pills. Activity Rest as needed. Do not have sex unless approved by your health care provider. Ask your health care provider when you may return to school or work. Eating and drinking Eat a balanced diet with plenty of fruits and vegetables, whole grains, and lowfat (lean) meats or non-meat proteins (such as beans or tofu). Drink enough fluids to keep your urine clear or pale yellow. Do not drink alcohol. General instructions Do not  share toothbrushes, nail clippers, or razors. Wash your hands frequently with soap and water. If soap and water are not  available, use hand sanitizer. Cover any cuts or open sores on your skin to prevent spreading the virus. Keep all follow-up visits as told by your health care provider. This is important. You may need follow-up visits every 6-12 months. How is this prevented? There is no vaccine for hepatitis C. The only way to prevent the disease is to reduce the risk of exposure to the virus. Make sure you: Wash your hands frequently with soap and water. If soap and water are not available, use hand sanitizer. Do not share needles or syringes. Practice safe sex and use condoms. Avoid handling blood or bodily fluids without gloves or other protection. Avoid getting tattoos or piercings in shops or other locations that are not clean.  Contact a health care provider if: You have a fever. You develop abdominal pain. You pass dark urine. You pass clay-colored stools. You develop joint pain. Get help right away if: You have increasing fatigue or weakness. You lose your appetite. You cannot eat or drink without vomiting. You develop jaundice or your jaundice gets worse. You bruise or bleed easily. Summary Hepatitis C is a viral infection of the liver. It can lead to scarring of the liver (cirrhosis), liver failure, or liver cancer. The hepatitis C virus (HCV) causes this condition. The virus can pass from person to person (is contagious). You should not take any medicines unless approved by your health care provider. This includes over-the-counter medicines and birth control pills. This information is not intended to replace advice given to you by your health care provider. Make sure you discuss any questions you have with your health care provider. Document Released: 02/22/2000 Document Revised: 04/01/2016 Document Reviewed: 04/01/2016 Elsevier Interactive Patient Education  [employer].

## 2023-05-28 NOTE — Progress Notes (Signed)
 Subjective:    Patient ID: Danny Carlson, male    DOB: November 09, 1965, 58 y.o.   MRN: [id number]  Chief Complaint  Patient presents with   New Patient (Initial Visit)    Hep c    HPI:  Danny Carlson is a 58 y.o. male with previous medical history of hypertension and alcohol presenting today for initial Hepatitis C visit.   Danny Carlson recently established in primary care and was noted to have elevated liver chemistries on two separate occasions with most recent being on 05/07/23 with AST 97 and ALT 57. Hepatitis C antibody was positive with Hepatitis C RNA level of 816,000.  HIV screen negative. Platelet count was 182,000. Covered by Medicaid.  This was the first that Danny Carlson has been diagnosed with Hepatitis C. Risk factor include age and history of drug usage with cocaine. Does have fatigue and itching and denies abdominal pain, nausea, vomiting, fever, scleral icterus or jaundice. No personal of family history of liver disease. Has not received treatment to date. Currently covered by Medicaid. Living is a shelter until end of April and working on finding employment. Smokes tobacco daily.   No Known Allergies    Outpatient Medications Prior to Visit  Medication Sig Dispense Refill   amLODipine (NORVASC) 10 MG tablet Take 1 tablet (10 mg total) by mouth daily. 90 tablet 2   Clindamycin-Benzoyl Per, Refr, gel Apply topically 2 (two) times daily. 45 g 0   doxepin (SINEQUAN) 150 MG capsule Take 1 capsule (150 mg total) by mouth at bedtime as needed. 60 capsule 2   fluticasone (FLONASE) 50 MCG/ACT nasal spray Place 2 sprays into both nostrils daily. 16 g 6   hydrOXYzine (ATARAX) 10 MG tablet Take 1 tablet (10 mg total) by mouth 3 (three) times daily as needed for itching. 60 tablet 2   valsartan-hydrochlorothiazide (DIOVAN-HCT) 160-25 MG tablet Take 1 tablet by mouth daily. 90 tablet 3   No facility-administered medications prior to visit.     Past Medical History:   Diagnosis Date   EtOH dependence (HCC)    Hypertension      No past surgical history on file.     Review of Systems  Constitutional:  Negative for chills, diaphoresis, fatigue and fever.  Respiratory:  Negative for cough, chest tightness, shortness of breath and wheezing.   Cardiovascular:  Negative for chest pain.  Gastrointestinal:  Negative for abdominal distention, abdominal pain, constipation, diarrhea, nausea and vomiting.  Neurological:  Negative for weakness and headaches.  Hematological:  Does not bruise/bleed easily.      Objective:    BP 118/68   Pulse 71   Temp 97.7 F (36.5 C) (Temporal)   Ht 5\' 4"  (1.626 m)   Wt 135 lb (61.2 kg)   SpO2 99%   BMI 23.17 kg/m  Nursing note and vital signs reviewed.  Physical Exam Constitutional:      General: He is not in acute distress.    Appearance: He is well-developed.  Cardiovascular:     Rate and Rhythm: Normal rate and regular rhythm.     Heart sounds: Normal heart sounds. No murmur heard.    No friction rub. No gallop.  Pulmonary:     Effort: Pulmonary effort is normal. No respiratory distress.     Breath sounds: Normal breath sounds. No wheezing or rales.  Chest:     Chest wall: No tenderness.  Abdominal:     General: Bowel sounds are normal. There is no  distension.     Palpations: Abdomen is soft. There is no mass.     Tenderness: There is no abdominal tenderness. There is no guarding or rebound.  Skin:    General: Skin is warm and dry.  Neurological:     Mental Status: He is alert and oriented to person, place, and time.  Psychiatric:        Behavior: Behavior normal.        Thought Content: Thought content normal.        Judgment: Judgment normal.         06/05/2022    9:16 AM  Depression screen PHQ 2/9  Decreased Interest 1  Down, Depressed, Hopeless 3  PHQ - 2 Score 4  Altered sleeping 1  Tired, decreased energy 3  Change in appetite 1  Feeling bad or failure about yourself  3   Trouble concentrating 2  Moving slowly or fidgety/restless 1  Suicidal thoughts 0  PHQ-9 Score 15       Assessment & Plan:    Patient Active Problem List   Diagnosis Date Noted   Chronic hepatitis C without hepatic coma (HCC) 05/10/2023   Edema of both lower extremities 04/14/2023   Chronic ITP (idiopathic thrombocytopenia) (HCC) 06/10/2022   Elevated LFTs 06/10/2022   Hypertension 06/05/2022   Acne vulgaris 06/05/2022   Muscle weakness 06/05/2022   Tremor 06/05/2022   Dental caries 06/05/2022     Problem List Items Addressed This Visit       Digestive   Chronic hepatitis C without hepatic coma (HCC) - Primary   Danny Carlson is a 58 y/o AA male with chronic hepatitis C with risk factors of age and history of drug use with cocaine. Initial Hepatitis C RNA level 816,000. Having fatigue and itching. Treatment naive. Discussed the basics of Hepatitis C including transmission, risk if left untreated, lab work, treatment options including side effects, available financial assistance, and plan of care. Given itching will also add AFP tumor mark with concern for advanced liver disease although CT imaging 1.5 years ago was negative. Obtain remaining lab work. Covered by Medicaid. Will plan for treatment with Mavyret or Eplcusa pending lab work results.       Relevant Orders   Hepatic function panel   Hepatitis C genotype   Liver Fibrosis, FibroTest-ActiTest   Protime-INR   Hepatitis B surface antigen   Hepatitis B surface antibody,quantitative   AFP tumor marker     I am having Danny Carlson maintain his fluticasone, Clindamycin-Benzoyl Per (Refr), hydrOXYzine, amLODipine, valsartan-hydrochlorothiazide, and doxepin.   Follow-up: 1 month after start of treatment or sooner if needed.   Marcos Eke, MSN, FNP-C Nurse Practitioner [facility] for Infectious Disease [facility] Main number: [phone]

## 2023-05-28 NOTE — Assessment & Plan Note (Signed)
 Mr. Berrocal is a 58 y/o AA male with chronic hepatitis C with risk factors of age and history of drug use with cocaine. Initial Hepatitis C RNA level 816,000. Having fatigue and itching. Treatment naive. Discussed the basics of Hepatitis C including transmission, risk if left untreated, lab work, treatment options including side effects, available financial assistance, and plan of care. Given itching will also add AFP tumor mark with concern for advanced liver disease although CT imaging 1.5 years ago was negative. Obtain remaining lab work. Covered by Medicaid. Will plan for treatment with Mavyret or Eplcusa pending lab work results.

## 2023-06-01 NOTE — Congregational Nurse Program (Signed)
 Client to RN office. He needs help with resources. He is currently at [facility] until the end of next month. He states he needs help getting a job and RN sent a referral to [employer] specialist Lelon Mast to assist client with getting a job.   Next RN called [employer] to see if he had been assigned a Futures trader. He has not been but service is now switched over to [employer] [phone]). We attempted to contact and Judeth Cornfield a case worker answered and said she would call client back in 30 minutes. She is made aware that he is need of housing and client will return to RN office on Wednesday at 10 am to resume accessing resources.

## 2023-06-03 LAB — LIVER FIBROSIS, FIBROTEST-ACTITEST
ALT: 61 U/L — ABNORMAL HIGH (ref 9–46)
Alpha-2-Macroglobulin: 356 mg/dL — ABNORMAL HIGH (ref 106–279)
Apolipoprotein A1: 192 mg/dL — ABNORMAL HIGH (ref 94–176)
Bilirubin: 0.7 mg/dL (ref 0.2–1.2)
Fibrosis Score: 0.8
GGT: 169 U/L — ABNORMAL HIGH (ref 3–85)
Haptoglobin: 34 mg/dL — ABNORMAL LOW (ref 43–212)
Necroinflammat ACT Score: 0.55
Reference ID: 5403452

## 2023-06-03 LAB — HEPATIC FUNCTION PANEL
AG Ratio: 0.7 (calc) — ABNORMAL LOW (ref 1.0–2.5)
ALT: 60 U/L — ABNORMAL HIGH (ref 9–46)
AST: 82 U/L — ABNORMAL HIGH (ref 10–35)
Albumin: 3.3 g/dL — ABNORMAL LOW (ref 3.6–5.1)
Alkaline phosphatase (APISO): 167 U/L — ABNORMAL HIGH (ref 35–144)
Bilirubin, Direct: 0.3 mg/dL — ABNORMAL HIGH (ref 0.0–0.2)
Globulin: 4.8 g/dL — ABNORMAL HIGH (ref 1.9–3.7)
Indirect Bilirubin: 0.4 mg/dL (ref 0.2–1.2)
Total Bilirubin: 0.7 mg/dL (ref 0.2–1.2)
Total Protein: 8.1 g/dL (ref 6.1–8.1)

## 2023-06-03 LAB — PROTIME-INR
INR: 1.3 — ABNORMAL HIGH
Prothrombin Time: 13.5 s — ABNORMAL HIGH (ref 9.0–11.5)

## 2023-06-03 LAB — HEPATITIS B SURFACE ANTIBODY, QUANTITATIVE: Hep B S AB Quant (Post): <5 m[IU]/mL — ABNORMAL LOW (ref >=10)

## 2023-06-03 LAB — HEPATITIS B SURFACE ANTIGEN: Hepatitis B Surface Ag: NONREACTIVE

## 2023-06-03 LAB — HEPATITIS C GENOTYPE

## 2023-06-03 LAB — AFP TUMOR MARKER: AFP-Tumor Marker: 56 ng/mL — ABNORMAL HIGH (ref <6.1)

## 2023-06-03 NOTE — Congregational Nurse Program (Signed)
 RN followed up with client in office in getting services set up. We contacted [facility] [phone]) for services to be started for housing programs, transportation and medication management. Spoke to case worker who states the [facility] supervisor is in a meeting all day today but will follow up by Friday.   RN also received email about job referral from [employer] for client and that they will be following up to schedule and appointment for intake. RN will f/u on status next Tuesday with client via phone.

## 2023-06-04 ENCOUNTER — Telehealth: Payer: Self-pay | Admitting: Family

## 2023-06-04 DIAGNOSIS — R772 Abnormality of alphafetoprotein: Secondary | ICD-10-CM

## 2023-06-04 DIAGNOSIS — K7400 Hepatic fibrosis, unspecified: Secondary | ICD-10-CM

## 2023-06-04 DIAGNOSIS — B182 Chronic viral hepatitis C: Secondary | ICD-10-CM

## 2023-06-04 NOTE — Telephone Encounter (Signed)
 Received Danny Carlson's lab work from his last office visit with fibrosis score of F4 and elevated AFP tumor marker. Previous CT in 2023 with no significant findings. Discussed need for imaging to rule out potential hepatocellular carcinoma and with his symptoms of itching has me concerned for advanced liver disease. Start with [place] and will obtain additional imaging pending results as needed. Hold Hepatitis C treatment for now pending imaging results. Spoke with Danny Carlson via phone regarding these results and need for imaging.   Marcos Eke, NP 06/04/2023 11:17 AM

## 2023-06-17 NOTE — Telephone Encounter (Signed)
 Wonderful!  Thanks for the update Raven.  I'm so glad he is being connected with services

## 2023-06-18 ENCOUNTER — Ambulatory Visit (HOSPITAL_COMMUNITY)
Admission: RE | Admit: 2023-06-18 | Discharge: 2023-06-18 | Disposition: A | Discharge status: Home / Self Care | Payer: MEDICAID | Source: Ambulatory Visit | Attending: Family | Admitting: Family

## 2023-06-18 DIAGNOSIS — K7400 Hepatic fibrosis, unspecified: Secondary | ICD-10-CM

## 2023-06-18 DIAGNOSIS — B182 Chronic viral hepatitis C: Secondary | ICD-10-CM

## 2023-06-18 DIAGNOSIS — R772 Abnormality of alphafetoprotein: Secondary | ICD-10-CM

## 2023-06-19 ENCOUNTER — Ambulatory Visit (HOSPITAL_COMMUNITY)
Admission: RE | Admit: 2023-06-19 | Discharge: 2023-06-19 | Disposition: A | Discharge status: Home / Self Care | Payer: MEDICAID | Source: Ambulatory Visit | Attending: Family | Admitting: Family

## 2023-06-19 DIAGNOSIS — R772 Abnormality of alphafetoprotein: Secondary | ICD-10-CM | POA: Insufficient documentation

## 2023-06-19 DIAGNOSIS — B182 Chronic viral hepatitis C: Secondary | ICD-10-CM | POA: Insufficient documentation

## 2023-06-19 DIAGNOSIS — K7400 Hepatic fibrosis, unspecified: Secondary | ICD-10-CM | POA: Diagnosis present

## 2023-07-06 ENCOUNTER — Other Ambulatory Visit: Payer: Self-pay

## 2023-07-16 ENCOUNTER — Encounter: Payer: Self-pay | Admitting: Physician Assistant

## 2023-07-16 ENCOUNTER — Ambulatory Visit: Payer: MEDICAID | Attending: Physician Assistant | Admitting: Physician Assistant

## 2023-07-16 ENCOUNTER — Other Ambulatory Visit: Payer: Self-pay

## 2023-07-16 ENCOUNTER — Ambulatory Visit: Payer: Self-pay

## 2023-07-16 VITALS — BP 122/63 | HR 86 | Resp 19 | Ht 64.0 in | Wt 140.8 lb

## 2023-07-16 DIAGNOSIS — B182 Chronic viral hepatitis C: Secondary | ICD-10-CM

## 2023-07-16 DIAGNOSIS — L42 Pityriasis rosea: Secondary | ICD-10-CM | POA: Diagnosis not present

## 2023-07-16 MED ORDER — TRIAMCINOLONE ACETONIDE 0.1 % EX CREA
1.0000 | TOPICAL_CREAM | Freq: Two times a day (BID) | CUTANEOUS | 0 refills | Status: AC
  Filled 2023-07-16: qty 80, 34d supply, fill #0

## 2023-07-16 MED ORDER — CETIRIZINE HCL 10 MG PO TABS
10.0000 mg | ORAL_TABLET | Freq: Every day | ORAL | 11 refills | Status: AC
  Filled 2023-07-16: qty 30, 30d supply, fill #0
  Filled 2023-12-09: qty 30, 30d supply, fill #1
  Filled 2024-02-09: qty 30, 30d supply, fill #2

## 2023-07-16 NOTE — Patient Instructions (Signed)
 Pityriasis rosea can manifest differently on darker skin tones compared to lighter skin. The herald patch, the initial, larger patch, may appear as a darker, more raised, and potentially papular lesion, often with a darker, almost black, appearance. The subsequent rash may also present with a more pronounced papular or papulovesicular appearance, and the Centers for the patches may exhibit a necrotic appearance. After the rash resolves, individuals with darker skin tones may experience post-inflammatory hyperpigmentation (darkening) or hypopigmentation (lightening), which can be more noticeable and may take longer to resolve than in individuals with lighter skin.  Here's a more detailed look at the changes: Initial Patch (Herald Patch): The herald patch, which typically starts as a single, oval, slightly raised, scaly patch, may appear as a darker, more raised, and potentially papular lesion in darker skin tones.  Subsequent Rash: The smaller, oval, scaly patches that follow the herald patch may also be more raised and may appear as dark red, purple, or darker brown, depending on the skin tone. The centers of the patches may also appear to be "dying" (necrotic).  Post-Inflammatory Changes: After the rash clears, individuals with darker skin may experience temporary patches of skin that are either darker or lighter than usual. This is known as post-inflammatory hyperpigmentation or hypopigmentation.  Resolution: The skin discolouration in individuals with darker skin may take longer to resolve compared to those with lighter skin.

## 2023-07-16 NOTE — Progress Notes (Signed)
 Patient ID: Danny Carlson, male   DOB: [birth year], 58 y.o.   MRN: [id number]   Danny Carlson, is a 58 y.o. male  MWU:[id number]  [id number]  DOB - February 21, 1966  Chief Complaint  Patient presents with   Pruritis       Subjective:   Danny Carlson is a 58 y.o. male here today for rash on back that started about 3 weeks ago then spread all over.  No fever.  No HA/Nausea or cold symptoms.    He is inquiring about Hepatitis C treatment.  Saw ID and they did labs and initiated imaging but have not followed up with him about possible treatment.   No problems updated.  ALLERGIES: No Known Allergies  PAST MEDICAL HISTORY: Past Medical History:  Diagnosis Date   EtOH dependence (HCC)    Hypertension     MEDICATIONS AT HOME: Prior to Admission medications   Medication Sig Start Date End Date Taking? Authorizing Provider  amLODipine  (NORVASC ) 10 MG tablet Take 1 tablet (10 mg total) by mouth daily. 04/08/23  Yes Vernell Goldsmith, MD  cetirizine (ZYRTEC) 10 MG tablet Take 1 tablet (10 mg total) by mouth daily. Prn itching 07/16/23  Yes Xan Ingraham M, PA-C  Clindamycin -Benzoyl Per, Refr, gel Apply topically 2 (two) times daily. 04/08/23  Yes Vernell Goldsmith, MD  doxepin  (SINEQUAN ) 150 MG capsule Take 1 capsule (150 mg total) by mouth at bedtime as needed. 05/07/23  Yes Vernell Goldsmith, MD  fluticasone  (FLONASE ) 50 MCG/ACT nasal spray Place 2 sprays into both nostrils daily. 06/05/22  Yes Vernell Goldsmith, MD  triamcinolone cream (KENALOG) 0.1 % Apply 1 Application topically 2 (two) times daily. 07/16/23  Yes Hassie Lint, PA-C  valsartan -hydrochlorothiazide  (DIOVAN -HCT) 160-25 MG tablet Take 1 tablet by mouth daily. 04/08/23  Yes Vernell Goldsmith, MD  hydrOXYzine  (ATARAX ) 10 MG tablet Take 1 tablet (10 mg total) by mouth 3 (three) times daily as needed for itching. Patient not taking: Reported on 07/16/2023 04/08/23   Vernell Goldsmith, MD    ROS: Neg HEENT Neg resp Neg  cardiac Neg GI Neg GU Neg MS Neg psych Neg neuro  Objective:   Vitals:   07/16/23 1403  BP: 122/63  Pulse: 86  Resp: 19  SpO2: 100%  Weight: 140 lb 12.8 oz (63.9 kg)  Height: 5\' 4"  (1.626 m)   Exam General appearance : Awake, alert, not in any distress. Speech Clear. Not toxic looking HEENT: Atraumatic and Normocephalic Neck: Supple, no JVD. No cervical lymphadenopathy.  Chest: Good air entry bilaterally, CTAB.  No rales/rhonchi/wheezing CVS: S1 S2 regular, no murmurs.  Extremities: B/L Lower Ext shows no edema, both legs are warm to touch Neurology: Awake alert, and oriented X 3, CN II-XII intact, Non focal Skin: Classic PR rash woth herald patch on upper R shoulder then small oval hyperpigmented lesions across back, chest and abdomen.  No secondary infection.   Data Review Lab Results  Component Value Date   HGBA1C 5.0 06/05/2022    Assessment & Plan   1. Pityriasis rosea (Primary) - cetirizine (ZYRTEC) 10 MG tablet; Take 1 tablet (10 mg total) by mouth daily. Prn itching  Dispense: 30 tablet; Refill: 11 - triamcinolone cream (KENALOG) 0.1 %; Apply 1 Application topically 2 (two) times daily.  Dispense: 80 g; Refill: 0  2. Chronic hepatitis C without hepatic coma (HCC) Will refer back so patient does not get lost to follow up! - Ambulatory referral to Infectious Disease  Return for for next appt with [place].  The patient was given clear instructions to go to ER or return to medical center if symptoms don't improve, worsen or new problems develop. The patient verbalized understanding. The patient was told to call to get lab results if they haven't heard anything in the next week.      Dulce Gibbs, PA-C [facility] [place], [place] [phone]   07/16/2023, 2:22 PM

## 2023-07-16 NOTE — Progress Notes (Signed)
 Itching start again 3 weeks ago. No OTC meds tried.

## 2023-07-16 NOTE — Telephone Encounter (Signed)
 Copied from CRM [id number]. Topic: Clinical - Red Word Triage >> Jul 16, 2023 12:42 PM Danny Carlson wrote: Red Word that prompted transfer to Nurse Triage: Establish care/Hep C getting worse   Chief Complaint: Itchiness Symptoms: Diffuse itchiness, bumps on back and abdomen  Frequency: Constant  Disposition: [] ED /[] Urgent Care (no appt availability in office) / [x] Appointment(In office/virtual)/ []  [facility]/ [] Home Care/ [] Refused Recommended Disposition /[] [facility]/ []  Follow-up with PCP Additional Notes: Patient reports that he has a history of Hepatitis C and has had worsening itchiness for the last few weeks. Patient states he also has bumps on his back and abdomen. Patient requesting an appointment for evaluation of his symptoms. Appointment made for the patient today for evaluation.     Reason for Disposition  [1] MODERATE-SEVERE widespread itching (i.e., interferes with sleep, normal activities or school) AND [2] not improved after 24 hours of itching Care Advice  Answer Assessment - Initial Assessment Questions 1. DESCRIPTION: "Describe the itching you are having."     Diffuse itchiness 2. SEVERITY: "How bad is it?"    - MILD: Doesn't interfere with normal activities.   - MODERATE-SEVERE: Interferes with work, school, sleep, or other activities.      Moderate to severe  4. ONSET: "When did this begin?"      Ongoing problem  5. CAUSE: "What do you think is causing the itching?" (ask about swimming pools, pollen, animals, soaps, etc.)     Hepatitis C 6. OTHER SYMPTOMS: "Do you have any other symptoms?"      Some bumps on abdomen and back  Protocols used: Itching - [facility]

## 2023-08-07 ENCOUNTER — Other Ambulatory Visit (HOSPITAL_COMMUNITY): Payer: Self-pay

## 2023-08-07 ENCOUNTER — Other Ambulatory Visit: Payer: Self-pay

## 2023-08-07 MED ORDER — SOFOSBUVIR-VELPATASVIR 400-100 MG PO TABS
1.0000 | ORAL_TABLET | Freq: Every day | ORAL | 2 refills | Status: AC
  Filled 2023-08-07: qty 28, 28d supply, fill #0
  Filled 2023-08-31: qty 28, 28d supply, fill #1
  Filled 2023-09-25: qty 28, 28d supply, fill #2

## 2023-08-07 NOTE — Progress Notes (Signed)
 Specialty Pharmacy Initial Fill Coordination Note  Danny Carlson is a 58 y.o. male contacted today regarding initial fill of specialty medication(s) Sofosbuvir-Velpatasvir   Patient requested Courier to Provider Office   Delivery date: 08/11/23   Verified address: [address] [place] [address]   Medication will be filled on 08/10/23.   Patient is aware of $4.00 copayment.  Put on AR/ACCT

## 2023-08-07 NOTE — Telephone Encounter (Signed)
 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

## 2023-08-07 NOTE — Addendum Note (Signed)
 Addended by: Khadeja Abt D on: 08/07/2023 09:37 AM   Modules accepted: Orders

## 2023-08-10 ENCOUNTER — Other Ambulatory Visit: Payer: Self-pay

## 2023-08-11 ENCOUNTER — Telehealth: Payer: Self-pay

## 2023-08-11 NOTE — Telephone Encounter (Signed)
 [facility] Patient Advocate Encounter  Patient's medications Epclusa  have been couriered to [facility] from [facility] and will be  picked up on 08/12/23.  Roylene Corn, CPhT Specialty Pharmacy Patient [facility] for Infectious Disease Phone: [phone] Fax:  [phone]

## 2023-08-12 ENCOUNTER — Ambulatory Visit: Payer: MEDICAID | Admitting: Family

## 2023-08-12 ENCOUNTER — Other Ambulatory Visit (HOSPITAL_COMMUNITY): Payer: Self-pay

## 2023-08-12 ENCOUNTER — Encounter: Payer: Self-pay | Admitting: Family

## 2023-08-12 ENCOUNTER — Other Ambulatory Visit: Payer: Self-pay | Admitting: Pharmacist

## 2023-08-12 ENCOUNTER — Other Ambulatory Visit: Payer: Self-pay

## 2023-08-12 VITALS — BP 143/74 | HR 87 | Temp 97.5°F | Ht 64.0 in | Wt 145.0 lb

## 2023-08-12 DIAGNOSIS — K7460 Unspecified cirrhosis of liver: Secondary | ICD-10-CM | POA: Diagnosis not present

## 2023-08-12 DIAGNOSIS — B182 Chronic viral hepatitis C: Secondary | ICD-10-CM

## 2023-08-12 NOTE — Progress Notes (Signed)
 t  Subjective:   Patient ID: Danny Carlson, male    DOB: 1965/05/06, 58 y.o.   MRN: [id number]  Chief Complaint  Patient presents with   Follow-up    Hep c    HPI:  Jvion Turgeon is a 58 y.o. male with chronic hepatitis C last seen on 05/28/23 for initial office visit.  Lab work showing a viral load of 816,000 with fibrosis score of F4 and genotype Ia.  Ultrasound completed with evidence of cirrhosis and no lesions.  AST elevated at 82 and ALT 60.  Child Pugh class A.  Here today for follow-up.  Mr. Saenz has been doing okay since his last office visit and continues to have no current symptoms and denies abdominal pain, nausea, vomiting, fatigue, fever, scleral icterus or jaundice.  Here today to start medication.  Has not been diagnosed previously with cirrhosis and not followed by gastroenterology.   No Known Allergies    Outpatient Medications Prior to Visit  Medication Sig Dispense Refill   amLODipine  (NORVASC ) 10 MG tablet Take 1 tablet (10 mg total) by mouth daily. 90 tablet 2   cetirizine  (ZYRTEC ) 10 MG tablet Take 1 tablet (10 mg total) by mouth daily as needed for itching. 30 tablet 11   Clindamycin -Benzoyl Per, Refr, gel Apply topically 2 (two) times daily. 45 g 0   doxepin  (SINEQUAN ) 150 MG capsule Take 1 capsule (150 mg total) by mouth at bedtime as needed. 60 capsule 2   fluticasone  (FLONASE ) 50 MCG/ACT nasal spray Place 2 sprays into both nostrils daily. 16 g 6   Sofosbuvir -Velpatasvir  (EPCLUSA ) 400-100 MG TABS Take 1 tablet by mouth daily. 28 tablet 2   triamcinolone  cream (KENALOG ) 0.1 % Apply 1 Application topically 2 (two) times daily. 80 g 0   valsartan -hydrochlorothiazide  (DIOVAN -HCT) 160-25 MG tablet Take 1 tablet by mouth daily. 90 tablet 3   hydrOXYzine  (ATARAX ) 10 MG tablet Take 1 tablet (10 mg total) by mouth 3 (three) times daily as needed for itching. (Patient not taking: Reported on 08/12/2023) 60 tablet 2   No facility-administered medications  prior to visit.     Past Medical History:  Diagnosis Date   EtOH dependence (HCC)    Hypertension      History reviewed. No pertinent surgical history.     Review of Systems  Constitutional:  Negative for chills, diaphoresis, fatigue and fever.  Respiratory:  Negative for cough, chest tightness, shortness of breath and wheezing.   Cardiovascular:  Negative for chest pain.  Gastrointestinal:  Negative for abdominal distention, abdominal pain, constipation, diarrhea, nausea and vomiting.  Neurological:  Negative for weakness and headaches.  Hematological:  Does not bruise/bleed easily.    Objective:   BP (!) 143/74   Pulse 87   Temp (!) 97.5 F (36.4 C) (Oral)   Ht 5\' 4"  (1.626 m)   Wt 145 lb (65.8 kg)   SpO2 99%   BMI 24.89 kg/m  Nursing note and vital signs reviewed.  Physical Exam Constitutional:      General: He is not in acute distress.    Appearance: He is well-developed.  Cardiovascular:     Rate and Rhythm: Normal rate and regular rhythm.     Heart sounds: Normal heart sounds. No murmur heard.    No friction rub. No gallop.  Pulmonary:     Effort: Pulmonary effort is normal. No respiratory distress.     Breath sounds: Normal breath sounds. No wheezing or rales.  Chest:  Chest wall: No tenderness.  Abdominal:     General: Bowel sounds are normal. There is no distension.     Palpations: Abdomen is soft. There is no mass.     Tenderness: There is no abdominal tenderness. There is no guarding or rebound.  Skin:    General: Skin is warm and dry.  Neurological:     Mental Status: He is alert and oriented to person, place, and time.     Assessment & Plan:    Patient Active Problem List   Diagnosis Date Noted   Cirrhosis of liver without ascites (HCC) 08/12/2023   Chronic hepatitis C without hepatic coma (HCC) 05/10/2023   Edema of both lower extremities 04/14/2023   Chronic ITP (idiopathic thrombocytopenia) (HCC) 06/10/2022   Elevated LFTs  06/10/2022   Hypertension 06/05/2022   Acne vulgaris 06/05/2022   Muscle weakness 06/05/2022   Tremor 06/05/2022   Dental caries 06/05/2022     Problem List Items Addressed This Visit       Digestive   Chronic hepatitis C without hepatic coma (HCC) - Primary   Mr. Milford has genotype Ia chronic hepatitis C with initial viral load of 816,000 and fibrosis score of F4 and ultrasound showing cirrhosis which is currently compensated without evidence of lesions or masses.  Will proceed with treatment with 12 weeks of Epclusa .  Counseled on medication administration and potential side effects.  Will plan for follow-up 1 month after start of medication to recheck hepatitis C RNA level.      Cirrhosis of liver without ascites [facility])   Mr. Grassia has a liver fibrosis score of F4 consistent with advanced liver disease and confirmed by ultrasound with cirrhosis with no evidence of lesions/masses.  Appears compensated with child class Pugh A.  Will refer to gastroenterology for further cirrhosis evaluation and management. Encouraged to avoid alcohol and acetaminophen .       Relevant Orders   Ambulatory referral to Gastroenterology     I am having Harout F. Brabant maintain his fluticasone , Clindamycin -Benzoyl Per (Refr), hydrOXYzine , amLODipine , valsartan -hydrochlorothiazide , doxepin , cetirizine , triamcinolone  cream, and Sofosbuvir -Velpatasvir .   Follow-up: Return in about 1 month (around 09/11/2023). or sooner if needed.   Marlan Silva, MSN, FNP-C Nurse Practitioner [facility] for Infectious Disease [facility] Main number: [phone]

## 2023-08-12 NOTE — Patient Instructions (Addendum)
 Nice to see you.  Start taking taking Epclusa  1 tablet by mouth daily x 12 weeks  Referral has been placed to Gastroenterolgy.  Plan for follow up in 1 month with Pharmacy or Erla Haw or sooner if needed with lab work on the same day.   Have a great day and stay safe!

## 2023-08-12 NOTE — Progress Notes (Signed)
 Specialty Pharmacy Initiation Note   Danny Carlson is a 58 y.o. male who will be followed by the specialty pharmacy service for RxSp Hepatitis C    Review of administration, indication, effectiveness, safety, potential side effects, storage/disposable, and missed dose instructions occurred today for patient's specialty medication(s) Sofosbuvir -Velpatasvir      Patient/Caregiver did not have any additional questions or concerns.   Patient's therapy is appropriate to: Initiate    Goals Addressed             This Visit's Progress    Achieve virologic cure as evidenced by SVR       Patient is initiating therapy. Patient will be evaluated at upcoming provider appointment to assess progress      Comply with lab assessments       Patient is on track. Patient will adhere to provider and/or lab appointments      Maintain optimal adherence to therapy       Patient is initiating therapy. Patient will be evaluated at upcoming provider appointment to assess progress         Sonya Duster Specialty Pharmacist

## 2023-08-12 NOTE — Assessment & Plan Note (Signed)
 Danny Carlson has genotype Ia chronic hepatitis C with initial viral load of 816,000 and fibrosis score of F4 and ultrasound showing cirrhosis which is currently compensated without evidence of lesions or masses.  Will proceed with treatment with 12 weeks of Epclusa .  Counseled on medication administration and potential side effects.  Will plan for follow-up 1 month after start of medication to recheck hepatitis C RNA level.

## 2023-08-12 NOTE — Progress Notes (Signed)
 Danny Carlson stated he counseled the patient during his appointment today.

## 2023-08-12 NOTE — Assessment & Plan Note (Addendum)
 [name] has a liver fibrosis score of F4 consistent with advanced liver disease and confirmed by ultrasound with cirrhosis with no evidence of lesions/masses.  Appears compensated with child class Pugh A.  Will refer to gastroenterology for further cirrhosis evaluation and management. Encouraged to avoid alcohol and acetaminophen .

## 2023-08-25 ENCOUNTER — Other Ambulatory Visit: Payer: Self-pay

## 2023-08-28 ENCOUNTER — Other Ambulatory Visit: Payer: Self-pay

## 2023-08-31 ENCOUNTER — Other Ambulatory Visit: Payer: Self-pay

## 2023-08-31 NOTE — Progress Notes (Signed)
 Specialty Pharmacy Refill Coordination Note  Danny Carlson is a 58 y.o. male contacted today regarding refills of specialty medication(s) Sofosbuvir -Velpatasvir    Patient requested Courier to Provider Office   Delivery date: 09/02/23   Verified address: [address] [place] [address]   Medication will be filled on 09/01/23.

## 2023-09-01 ENCOUNTER — Other Ambulatory Visit: Payer: Self-pay

## 2023-09-02 ENCOUNTER — Telehealth: Payer: Self-pay

## 2023-09-02 NOTE — Telephone Encounter (Signed)
 Medication was picked up at [facility] on 09/02/23 @ 3:20pm

## 2023-09-02 NOTE — Telephone Encounter (Signed)
 [facility] Patient Advocate Encounter  Patient's medications EPCLUSA  have been couriered to [facility] from [facility] and will be picked-up at  [facility].  Patient is aware medication has made it to the office ([facility])  Charmaine Sharps, CPhT Specialty Pharmacy Patient [facility] for Infectious Disease Phone: [phone] Fax:  [phone]

## 2023-09-09 ENCOUNTER — Other Ambulatory Visit: Payer: Self-pay

## 2023-09-09 ENCOUNTER — Other Ambulatory Visit: Payer: Self-pay | Admitting: Critical Care Medicine

## 2023-09-09 ENCOUNTER — Telehealth: Payer: Self-pay | Admitting: Critical Care Medicine

## 2023-09-09 ENCOUNTER — Encounter: Payer: Self-pay | Admitting: Physician Assistant

## 2023-09-09 ENCOUNTER — Encounter: Payer: Self-pay | Admitting: *Deleted

## 2023-09-09 MED ORDER — DOXYCYCLINE HYCLATE 100 MG PO TABS
100.0000 mg | ORAL_TABLET | Freq: Two times a day (BID) | ORAL | 0 refills | Status: AC
  Filled 2023-09-09: qty 14, 7d supply, fill #0

## 2023-09-09 NOTE — Congregational Nurse Program (Signed)
  Dept: [phone]   Congregational Nurse Program Note  Date of Encounter: 09/09/2023  Past Medical History: Past Medical History:  Diagnosis Date   EtOH dependence (HCC)    Hypertension     Encounter Details:  Community Questionnaire - 09/09/23 1018       Questionnaire   Ask client: Do you give verbal consent for me to treat you today? Yes    Student Assistance N/A    Location Patient Served  [facility]    Encounter Setting CN site    Population Status Unhoused    Insurance Medicaid    Insurance/Financial Assistance Referral N/A    Medication N/A    Medical Provider Yes    Screening Referrals Made N/A    Medical Referrals Made [facility] P[facility]    Medical Appointment Completed N/A    CNP Interventions Advocate/Support;Navigate Healthcare System    Screenings CN Performed Blood Pressure;Weight    ED Visit Averted N/A    Life-Saving Intervention Made N/A         Client came to nurse's office c/o raised area behind rt ear with soreness. Completed triage for to see MD today in [facility] clinic. Blood pressure 137/74, pulse 88, height 5' 4 (1.626 m), weight 144 lb 3.2 oz (65.4 kg), SpO2 96%.  Saiya Crist W RN CN

## 2023-09-09 NOTE — Telephone Encounter (Signed)
 Good day, Dr. Brien. I have spoken to providers and on one in the office does I&D's. I sorry but he will have to go to [facility] or ED.

## 2023-09-09 NOTE — Progress Notes (Signed)
 Abx for abscess behind right ear

## 2023-09-09 NOTE — Progress Notes (Signed)
 Pt has a knot behind his R ear, there is an area that is palpable, a little sore to touch and he says it hurts at times. It needs ABX and I&D. He will be set up for an appt. He will get Doxy >> to pick up himself. He was given bus passes.   He had a similar problem on the L side but he was able to pop it, it went away and did not come back.   He is compliant w/ Hep C meds and f/u visits.   He thinks that mask-wearing exacerbates the situation.   Shona Shad, PA-C 09/09/2023 3:10 PM

## 2023-09-09 NOTE — Telephone Encounter (Signed)
 Danny Carlson  any way we can get this patient of mine who has a small abscess behind left ear  Needs to be drained  [facility]  we do not do drainage here out of scope of practice   Let me know timing  should be a very quick visit

## 2023-09-10 ENCOUNTER — Other Ambulatory Visit: Payer: Self-pay

## 2023-09-10 ENCOUNTER — Ambulatory Visit: Payer: Self-pay

## 2023-09-10 NOTE — Telephone Encounter (Signed)
 FYI Only or Action Required?: FYI only for provider.  Patient was last seen in primary care on 07/16/2023 by Danton Jon HERO, PA-C. Called Nurse Triage reporting Advice Only. Symptoms began unknown. Interventions attempted: Other: ABX started saw Dr. Brien yesterday. Symptoms are: Infection behind ear.unknown. Call from [facility], water.  Triage Disposition: Information or Advice Only Call  Patient/caregiver understands and will follow disposition?: Yes                                      Copied from CRM [id number]. Topic: Clinical - Red Word Triage >> Sep 10, 2023  9:22 AM Elle L wrote: [facility] that prompted transfer to Nurse Triage: Slater, [facility] and Wellness Nurse, states that Dr. Brien saw the patient yesterday in the community and the patient had an abscess that is causing him pain but he was unable to treat him at that time so he reached out to Nurse Slater to have us  get the patient scheduled into the office. However, I am Nurse Triaging due to the patient experiencing pain. Reason for Disposition  General information question, no triage required and triager able to answer question  Answer Assessment - Initial Assessment Questions 1. REASON FOR CALL or QUESTION: What is your reason for calling today? or How can I best help you? or What question do you have that I can help answer?     Pt needs an appt to drain an abscess. No appts availble - see notes between Cassandra and Dr. Brien. Pt will need to go to UC or ED for care. S/w Zora at the clinic.  Protocols used: Information Only Call - No Triage-A-AH

## 2023-09-10 NOTE — Telephone Encounter (Signed)
 Noted

## 2023-09-14 ENCOUNTER — Encounter: Payer: Self-pay | Admitting: Physician Assistant

## 2023-09-14 ENCOUNTER — Ambulatory Visit (INDEPENDENT_AMBULATORY_CARE_PROVIDER_SITE_OTHER): Payer: MEDICAID | Admitting: Family

## 2023-09-14 ENCOUNTER — Other Ambulatory Visit: Payer: Self-pay

## 2023-09-14 ENCOUNTER — Encounter: Payer: Self-pay | Admitting: Family

## 2023-09-14 VITALS — BP 115/64 | HR 80 | Temp 98.7°F | Ht 64.0 in | Wt 143.0 lb

## 2023-09-14 DIAGNOSIS — K7460 Unspecified cirrhosis of liver: Secondary | ICD-10-CM

## 2023-09-14 DIAGNOSIS — B182 Chronic viral hepatitis C: Secondary | ICD-10-CM

## 2023-09-14 NOTE — Patient Instructions (Signed)
Nice to see you.  We will check your lab work today.  Continue to take your medication daily as prescribed.  Plan for follow up in 2 months or sooner if needed with lab work on the same day.  Have a great day and stay safe!

## 2023-09-14 NOTE — Assessment & Plan Note (Signed)
 Mr. Gianino is a 58 year old African-American male with chronic hepatitis C presenting following 1 month of Epclusa  with no adverse side effects or problems obtaining medication.  Remains asymptomatic.  Discussed plan of care to continue current dose of Epclusa  and check hepatitis C RNA level.  Will obtain refill of medication from the clinic.  Plan for follow-up in 2 months at end of treatment and repeat blood work or sooner if needed.

## 2023-09-14 NOTE — Progress Notes (Signed)
 Subjective:   Patient ID: Danny Carlson, male    DOB: 1966-01-27, 58 y.o.   MRN: [id number]  Chief Complaint  Patient presents with   Follow-up   Hepatitis C    HPI:  Danny Carlson is a 58 y.o. male with chronic Hepatitis C last seen on 08/12/23 with Genotype 1a and initial viral load of 816,000 with fibrosis score of F4 and imaging showing cirrhosis (compensated) with Child Pugh Class A. Started on Epclusa  and here today for 1 month follow up.    Danny Carlson has been doing well since his last office visit and continues to take Epclusa  as prescribed with no adverse side effects or problems obtaining medication.  No current symptoms and denies abdominal pain, nausea, vomiting, fatigue, fever, scleral icterus or jaundice.    No Known Allergies    Outpatient Medications Prior to Visit  Medication Sig Dispense Refill   amLODipine  (NORVASC ) 10 MG tablet Take 1 tablet (10 mg total) by mouth daily. 90 tablet 2   cetirizine  (ZYRTEC ) 10 MG tablet Take 1 tablet (10 mg total) by mouth daily as needed for itching. 30 tablet 11   Clindamycin -Benzoyl Per, Refr, gel Apply topically 2 (two) times daily. 45 g 0   doxepin  (SINEQUAN ) 150 MG capsule Take 1 capsule (150 mg total) by mouth at bedtime as needed. 60 capsule 2   doxycycline  (VIBRA -TABS) 100 MG tablet Take 1 tablet (100 mg total) by mouth 2 (two) times daily for 7 days. 14 tablet 0   fluticasone  (FLONASE ) 50 MCG/ACT nasal spray Place 2 sprays into both nostrils daily. 16 g 6   hydrOXYzine  (ATARAX ) 10 MG tablet Take 1 tablet (10 mg total) by mouth 3 (three) times daily as needed for itching. 60 tablet 2   Sofosbuvir -Velpatasvir  (EPCLUSA ) 400-100 MG TABS Take 1 tablet by mouth daily. 28 tablet 2   triamcinolone  cream (KENALOG ) 0.1 % Apply 1 Application topically 2 (two) times daily. 80 g 0   valsartan -hydrochlorothiazide  (DIOVAN -HCT) 160-25 MG tablet Take 1 tablet by mouth daily. 90 tablet 3   No facility-administered medications  prior to visit.     Past Medical History:  Diagnosis Date   EtOH dependence (HCC)    Hypertension      History reviewed. No pertinent surgical history.     Review of Systems  Constitutional:  Negative for chills, diaphoresis, fatigue and fever.  Respiratory:  Negative for cough, chest tightness, shortness of breath and wheezing.   Cardiovascular:  Negative for chest pain.  Gastrointestinal:  Negative for abdominal distention, abdominal pain, constipation, diarrhea, nausea and vomiting.  Neurological:  Negative for weakness and headaches.  Hematological:  Does not bruise/bleed easily.    Objective:   BP 115/64   Pulse 80   Temp 98.7 F (37.1 C) (Temporal)   Ht 5' 4 (1.626 m)   Wt 143 lb (64.9 kg)   SpO2 98%   BMI 24.55 kg/m  Nursing note and vital signs reviewed.  Physical Exam Constitutional:      General: He is not in acute distress.    Appearance: He is well-developed.  Cardiovascular:     Rate and Rhythm: Normal rate and regular rhythm.     Heart sounds: Normal heart sounds. No murmur heard.    No friction rub. No gallop.  Pulmonary:     Effort: Pulmonary effort is normal. No respiratory distress.     Breath sounds: Normal breath sounds. No wheezing or rales.  Chest:  Chest wall: No tenderness.  Abdominal:     General: Bowel sounds are normal. There is no distension.     Palpations: Abdomen is soft. There is no mass.     Tenderness: There is no abdominal tenderness. There is no guarding or rebound.  Skin:    General: Skin is warm and dry.  Neurological:     Mental Status: He is alert and oriented to person, place, and time.     Assessment & Plan:    Patient Active Problem List   Diagnosis Date Noted   Cirrhosis of liver without ascites (HCC) 08/12/2023   Chronic hepatitis C without hepatic coma (HCC) 05/10/2023   Edema of both lower extremities 04/14/2023   Chronic ITP (idiopathic thrombocytopenia) (HCC) 06/10/2022   Elevated LFTs  06/10/2022   Hypertension 06/05/2022   Acne vulgaris 06/05/2022   Muscle weakness 06/05/2022   Tremor 06/05/2022   Dental caries 06/05/2022     Problem List Items Addressed This Visit       Digestive   Chronic hepatitis C without hepatic coma (HCC) - Primary   Danny Carlson is a 58 year old African-American male with chronic hepatitis C presenting following 1 month of Epclusa  with no adverse side effects or problems obtaining medication.  Remains asymptomatic.  Discussed plan of care to continue current dose of Epclusa  and check hepatitis C RNA level.  Will obtain refill of medication from the clinic.  Plan for follow-up in 2 months at end of treatment and repeat blood work or sooner if needed.      Relevant Orders   Hepatitis C RNA quantitative   Cirrhosis of liver without ascites [facility])   Danny Carlson has compensated cirrhosis with Child-Pugh class A and no evidence of lesions on ultrasound.  Reminded of referral to establish with gastroenterology for cirrhosis management.  Contact information provided with after visit summary.      Relevant Orders   Hepatitis C RNA quantitative     I am having Danny Carlson maintain his fluticasone , Clindamycin -Benzoyl Per (Refr), hydrOXYzine , amLODipine , valsartan -hydrochlorothiazide , doxepin , cetirizine , triamcinolone  cream, Sofosbuvir -Velpatasvir , and doxycycline .   Follow-up: Return in about 2 months (around 11/15/2023). or sooner if needed.   Cathlyn July, MSN, FNP-C Nurse Practitioner [facility] for Infectious Disease [facility] Main number: [phone]

## 2023-09-14 NOTE — Assessment & Plan Note (Signed)
 Danny Carlson has compensated cirrhosis with Child-Pugh class A and no evidence of lesions on ultrasound.  Reminded of referral to establish with gastroenterology for cirrhosis management.  Contact information provided with after visit summary.

## 2023-09-16 ENCOUNTER — Ambulatory Visit: Payer: Self-pay | Admitting: Family

## 2023-09-16 LAB — HEPATITIS C RNA QUANTITATIVE
HCV Quantitative Log: <1.18 {Log_IU}/mL
HCV RNA, PCR, QN: <15 [IU]/mL

## 2023-09-16 NOTE — Telephone Encounter (Signed)
-----   Message from Cordella July sent at 09/16/2023 11:45 AM EDT ----- Please inform Mr. Arlotta that his Hepatitis C RNA level is not detected which is where we want him to be at this point. Continue to complete the medication as discussed and follow up in 2 months.  ----- Message ----- From: Interface, [facility] Lab Results In Sent: 09/16/2023   1:59 AM EDT To: Cordella JONETTA July, FNP

## 2023-09-16 NOTE — Telephone Encounter (Signed)
 Called and reviewed results with patient. No questions at this time. Lorenda CHRISTELLA Code, RMA

## 2023-09-18 ENCOUNTER — Ambulatory Visit (HOSPITAL_COMMUNITY)
Admission: EM | Admit: 2023-09-18 | Discharge: 2023-09-18 | Disposition: A | Discharge status: Home / Self Care | Payer: MEDICAID | Attending: Physician Assistant | Admitting: Physician Assistant

## 2023-09-18 ENCOUNTER — Other Ambulatory Visit: Payer: Self-pay

## 2023-09-18 ENCOUNTER — Encounter (HOSPITAL_COMMUNITY): Payer: Self-pay | Admitting: Emergency Medicine

## 2023-09-18 DIAGNOSIS — L0291 Cutaneous abscess, unspecified: Secondary | ICD-10-CM | POA: Diagnosis not present

## 2023-09-18 MED ORDER — DOXYCYCLINE HYCLATE 100 MG PO TABS
100.0000 mg | ORAL_TABLET | Freq: Two times a day (BID) | ORAL | 0 refills | Status: AC
  Filled 2023-09-18: qty 14, 7d supply, fill #0

## 2023-09-18 MED ORDER — DOXYCYCLINE HYCLATE 100 MG PO CAPS: 100.0000 mg | ORAL_CAPSULE | Freq: Two times a day (BID) | ORAL | 0 refills | Status: DC

## 2023-09-18 NOTE — ED Provider Notes (Signed)
 [facility]    CSN: [id number] Arrival date & time: 09/18/23  0827      History   Chief Complaint Chief Complaint  Patient presents with   Abscess    HPI Danny Carlson is a 58 y.o. male.   Patient here today for evaluation of small abscess behind his right ear that started a couple months ago.  He reports there is occasional pain at times.  He denies any fever or other symptoms.  There has not been any drainage from the area.  The history is provided by the patient.  Abscess Associated symptoms: no fever     Past Medical History:  Diagnosis Date   EtOH dependence (HCC)    Hypertension     Patient Active Problem List   Diagnosis Date Noted   Cirrhosis of liver without ascites (HCC) 08/12/2023   Chronic hepatitis C without hepatic coma (HCC) 05/10/2023   Edema of both lower extremities 04/14/2023   Chronic ITP (idiopathic thrombocytopenia) (HCC) 06/10/2022   Elevated LFTs 06/10/2022   Hypertension 06/05/2022   Acne vulgaris 06/05/2022   Muscle weakness 06/05/2022   Tremor 06/05/2022   Dental caries 06/05/2022    History reviewed. No pertinent surgical history.     Home Medications    Prior to Admission medications   Medication Sig Start Date End Date Taking? Authorizing Provider  amLODipine  (NORVASC ) 10 MG tablet Take 1 tablet (10 mg total) by mouth daily. 04/08/23   Brien Belvie BRAVO, MD  cetirizine  (ZYRTEC ) 10 MG tablet Take 1 tablet (10 mg total) by mouth daily as needed for itching. 07/16/23   Danton Jon HERO, PA-C  Clindamycin -Benzoyl Per, Refr, gel Apply topically 2 (two) times daily. 04/08/23   Brien Belvie BRAVO, MD  doxepin  (SINEQUAN ) 150 MG capsule Take 1 capsule (150 mg total) by mouth at bedtime as needed. 05/07/23   Brien Belvie BRAVO, MD  doxycycline  (VIBRA -TABS) 100 MG tablet Take 1 tablet (100 mg total) by mouth 2 (two) times daily for 7 days. 09/18/23 09/25/23  Billy Asberry FALCON, PA-C  fluticasone  (FLONASE ) 50 MCG/ACT nasal spray  Place 2 sprays into both nostrils daily. 06/05/22   Brien Belvie BRAVO, MD  hydrOXYzine  (ATARAX ) 10 MG tablet Take 1 tablet (10 mg total) by mouth 3 (three) times daily as needed for itching. 04/08/23   Brien Belvie BRAVO, MD  Sofosbuvir -Velpatasvir  (EPCLUSA ) 400-100 MG TABS Take 1 tablet by mouth daily. 08/07/23   Calone, Gregory D, FNP  triamcinolone  cream (KENALOG ) 0.1 % Apply 1 Application topically 2 (two) times daily. 07/16/23   Danton Jon HERO, PA-C  valsartan -hydrochlorothiazide  (DIOVAN -HCT) 160-25 MG tablet Take 1 tablet by mouth daily. 04/08/23   Brien Belvie BRAVO, MD    Family History Family History  Problem Relation Age of Onset   Cancer Father     Social History Social History   Tobacco Use   Smoking status: Every Day    Current packs/day: 0.50    Average packs/day: 0.5 packs/day for 20.0 years (10.0 ttl pk-yrs)    Types: Cigarettes   Smokeless tobacco: Never  Vaping Use   Vaping status: Never Used  Substance Use Topics   Alcohol use: Yes   Drug use: No     Allergies   Patient has no known allergies.   Review of Systems Review of Systems  Constitutional:  Negative for chills and fever.  Eyes:  Negative for discharge and redness.  Respiratory:  Negative for shortness of breath.   Skin:  Negative  for color change and wound.  Neurological:  Negative for numbness.     Physical Exam Triage Vital Signs ED Triage Vitals [09/18/23 0914]  Encounter Vitals Group     BP (!) 146/82     Girls Systolic BP Percentile      Girls Diastolic BP Percentile      Boys Systolic BP Percentile      Boys Diastolic BP Percentile      Pulse Rate 74     Resp 16     Temp 97.7 F (36.5 C)     Temp Source Oral     SpO2 97 %     Weight      Height      Head Circumference      Peak Flow      Pain Score 0     Pain Loc      Pain Education      Exclude from Growth Chart    No data found.  Updated Vital Signs BP (!) 146/82 (BP Location: Left Arm)   Pulse 74   Temp 97.7 F  (36.5 C) (Oral)   Resp 16   SpO2 97%   Visual Acuity Right Eye Distance:   Left Eye Distance:   Bilateral Distance:    Right Eye Near:   Left Eye Near:    Bilateral Near:     Physical Exam Vitals and nursing note reviewed.  Constitutional:      General: He is not in acute distress.    Appearance: Normal appearance. He is not ill-appearing.  HENT:     Head: Normocephalic and atraumatic.  Eyes:     Conjunctiva/sclera: Conjunctivae normal.  Cardiovascular:     Rate and Rhythm: Normal rate.  Pulmonary:     Effort: Pulmonary effort is normal.  Skin:    Comments: Approximately 5 mm area of induration with minimal tenderness palpation just posterior to right auricle, no wound, no drainage  Neurological:     Mental Status: He is alert.  Psychiatric:        Mood and Affect: Mood normal.        Behavior: Behavior normal.        Thought Content: Thought content normal.      UC Treatments / Results  Labs (all labs ordered are listed, but only abnormal results are displayed) Labs Reviewed - No data to display  EKG   Radiology No results found.  Procedures Procedures (including critical care time)  Medications Ordered in UC Medications - No data to display  Initial Impression / Assessment and Plan / UC Course  I have reviewed the triage vital signs and the nursing notes.  Pertinent labs & imaging results that were available during my care of the patient were reviewed by me and considered in my medical decision making (see chart for details).    Will treat to cover possible infectious cause of symptoms with doxycycline  but discussed could be cystic lesion given duration.  Recommended follow-up with primary care should symptoms not improve or resolve with treatment.  Final Clinical Impressions(s) / UC Diagnoses   Final diagnoses:  Abscess     Discharge Instructions       Please follow up with primary care provider if no improvement.       ED  Prescriptions     Medication Sig Dispense Auth. Provider   doxycycline  (VIBRAMYCIN ) 100 MG capsule  (Status: Discontinued) Take 1 capsule (100 mg total) by mouth 2 (two) times daily for  7 days. 14 capsule Billy Stabs F, PA-C   doxycycline  (VIBRA -TABS) 100 MG tablet Take 1 tablet (100 mg total) by mouth 2 (two) times daily for 7 days. 14 tablet Billy Stabs FALCON, PA-C      PDMP not reviewed this encounter.   Billy Stabs FALCON, PA-C 09/18/23 1009

## 2023-09-18 NOTE — ED Triage Notes (Signed)
 Pt c/o small abscess behind right ear for several months.  Pt denies any pain or drainage

## 2023-09-18 NOTE — Discharge Instructions (Signed)
  Please follow up with primary care provider if no improvement.

## 2023-09-25 ENCOUNTER — Other Ambulatory Visit: Payer: Self-pay

## 2023-09-25 ENCOUNTER — Other Ambulatory Visit (HOSPITAL_COMMUNITY): Payer: Self-pay

## 2023-09-25 NOTE — Progress Notes (Signed)
 Specialty Pharmacy Refill Coordination Note  Danny Carlson is a 58 y.o. male contacted today regarding refills of specialty medication(s) Sofosbuvir -Velpatasvir    Patient requested Courier to Provider Office   Delivery date: 09/28/23   Verified address: [address] [place] [address]   Medication will be filled on 09/25/23.

## 2023-09-28 ENCOUNTER — Telehealth: Payer: Self-pay

## 2023-09-28 ENCOUNTER — Other Ambulatory Visit: Payer: Self-pay

## 2023-09-28 NOTE — Telephone Encounter (Signed)
 [facility] Patient Advocate Encounter  Patient's medications Epclusa  have been couriered to [facility] from [facility] and will be  picked up on 09/28/23.  1st Epclusa  box.  Arland Hutchinson, CPhT Specialty Pharmacy Patient [facility] Phone: [phone] Fax:  [phone]

## 2023-10-12 ENCOUNTER — Other Ambulatory Visit: Payer: Self-pay

## 2023-10-13 ENCOUNTER — Other Ambulatory Visit: Payer: Self-pay

## 2023-10-16 ENCOUNTER — Other Ambulatory Visit: Payer: Self-pay

## 2023-10-16 NOTE — Progress Notes (Signed)
 12 weeks of therapy, all 3 fills dispensed.

## 2023-10-21 ENCOUNTER — Other Ambulatory Visit: Payer: Self-pay

## 2023-10-26 NOTE — Progress Notes (Unsigned)
   HPI: Danny Carlson is a 58 y.o. male who presents to the [facility] pharmacy clinic for Hepatitis C follow-up.  Medication: Epclusa   Start Date: 08/10/23  Hepatitis C Genotype: 1a  Fibrosis Score: 0.80, stage F4  Hepatitis C RNA: <15 undetectable  Patient Active Problem List   Diagnosis Date Noted   Cirrhosis of liver without ascites (HCC) 08/12/2023   Chronic hepatitis C without hepatic coma (HCC) 05/10/2023   Edema of both lower extremities 04/14/2023   Chronic ITP (idiopathic thrombocytopenia) (HCC) 06/10/2022   Elevated LFTs 06/10/2022   Hypertension 06/05/2022   Acne vulgaris 06/05/2022   Muscle weakness 06/05/2022   Tremor 06/05/2022   Dental caries 06/05/2022    Patient's Medications  New Prescriptions   No medications on file  Previous Medications   AMLODIPINE  (NORVASC ) 10 MG TABLET    Take 1 tablet (10 mg total) by mouth daily.   CETIRIZINE  (ZYRTEC ) 10 MG TABLET    Take 1 tablet (10 mg total) by mouth daily as needed for itching.   CLINDAMYCIN -BENZOYL PER, REFR, GEL    Apply topically 2 (two) times daily.   DOXEPIN  (SINEQUAN ) 150 MG CAPSULE    Take 1 capsule (150 mg total) by mouth at bedtime as needed.   FLUTICASONE  (FLONASE ) 50 MCG/ACT NASAL SPRAY    Place 2 sprays into both nostrils daily.   HYDROXYZINE  (ATARAX ) 10 MG TABLET    Take 1 tablet (10 mg total) by mouth 3 (three) times daily as needed for itching.   SOFOSBUVIR -VELPATASVIR  (EPCLUSA ) 400-100 MG TABS    Take 1 tablet by mouth daily.   TRIAMCINOLONE  CREAM (KENALOG ) 0.1 %    Apply 1 Application topically 2 (two) times daily.   VALSARTAN -HYDROCHLOROTHIAZIDE  (DIOVAN -HCT) 160-25 MG TABLET    Take 1 tablet by mouth daily.  Modified Medications   No medications on file  Discontinued Medications   No medications on file    Labs: Hepatitis C Lab Results  Component Value Date   HCVGENOTYPE 1a 05/28/2023   HCVRNAPCRQN <15 NOT DETECTED 09/14/2023   FIBROSTAGE F4 05/28/2023   Hepatitis B Lab Results   Component Value Date   HEPBSAG NON-REACTIVE 05/28/2023   Hepatitis A No results found for: HAV HIV Lab Results  Component Value Date   HIV Non Reactive 05/07/2023   Lab Results  Component Value Date   CREATININE 0.97 05/07/2023   CREATININE 0.81 06/05/2022   CREATININE 1.20 01/23/2022   CREATININE 0.68 01/23/2022   CREATININE 0.87 10/12/2021   Lab Results  Component Value Date   AST 82 (H) 05/28/2023   AST 97 (H) 05/07/2023   AST 83 (H) 06/05/2022   ALT 60 (H) 05/28/2023   ALT 61 (H) 05/28/2023   ALT 57 (H) 05/07/2023   INR 1.3 (H) 05/28/2023    Assessment: ***Danny Carlson presents for his 1 month HepC follow up. He is on ___ This is week ____ of _____. He reports his is doing ______. Missed doses, adherence. 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  LFTs were elevated on 05/28/23. His last viral level was undetectable on 09/14/23 compared to his initial level of 816,000 on 05/07/23. Today we will assess his response to therapy and get a HCV level as well as follow up on his liver enzymes with a CMP.    Plan: -Check HCV viral load -Check CMP  Elma Fail, PharmD PGY1 Clinical Pharmacist [facility]  10/26/2023 11:22 AM

## 2023-10-27 ENCOUNTER — Ambulatory Visit: Payer: MEDICAID | Admitting: Pharmacist

## 2023-11-03 ENCOUNTER — Telehealth: Payer: Self-pay | Admitting: Critical Care Medicine

## 2023-11-03 NOTE — Telephone Encounter (Signed)
 Pt confirmed appt 8/26

## 2023-11-04 ENCOUNTER — Other Ambulatory Visit (HOSPITAL_COMMUNITY): Payer: Self-pay

## 2023-11-04 ENCOUNTER — Encounter: Payer: Self-pay | Admitting: Family Medicine

## 2023-11-04 ENCOUNTER — Ambulatory Visit: Payer: MEDICAID | Attending: Family Medicine | Admitting: Family Medicine

## 2023-11-04 ENCOUNTER — Ambulatory Visit: Payer: MEDICAID | Admitting: Physician Assistant

## 2023-11-04 ENCOUNTER — Encounter: Payer: Self-pay | Admitting: Physician Assistant

## 2023-11-04 ENCOUNTER — Other Ambulatory Visit: Payer: Self-pay

## 2023-11-04 ENCOUNTER — Telehealth: Payer: Self-pay

## 2023-11-04 ENCOUNTER — Other Ambulatory Visit (INDEPENDENT_AMBULATORY_CARE_PROVIDER_SITE_OTHER): Payer: MEDICAID

## 2023-11-04 VITALS — BP 130/68 | HR 75 | Ht 64.0 in | Wt 141.4 lb

## 2023-11-04 VITALS — BP 142/70 | HR 65 | Ht 64.0 in | Wt 143.6 lb

## 2023-11-04 DIAGNOSIS — F1010 Alcohol abuse, uncomplicated: Secondary | ICD-10-CM

## 2023-11-04 DIAGNOSIS — I10 Essential (primary) hypertension: Secondary | ICD-10-CM

## 2023-11-04 DIAGNOSIS — F17209 Nicotine dependence, unspecified, with unspecified nicotine-induced disorders: Secondary | ICD-10-CM

## 2023-11-04 DIAGNOSIS — Z1211 Encounter for screening for malignant neoplasm of colon: Secondary | ICD-10-CM | POA: Diagnosis not present

## 2023-11-04 DIAGNOSIS — Z5900 Homelessness unspecified: Secondary | ICD-10-CM | POA: Diagnosis not present

## 2023-11-04 DIAGNOSIS — B182 Chronic viral hepatitis C: Secondary | ICD-10-CM

## 2023-11-04 DIAGNOSIS — M159 Polyosteoarthritis, unspecified: Secondary | ICD-10-CM | POA: Diagnosis not present

## 2023-11-04 DIAGNOSIS — K7460 Unspecified cirrhosis of liver: Secondary | ICD-10-CM

## 2023-11-04 LAB — CBC WITH DIFFERENTIAL/PLATELET
Basophils Absolute: 0.1 K/uL (ref 0.0–0.1)
Basophils Relative: 0.9 % (ref 0.0–3.0)
Eosinophils Absolute: 0.3 K/uL (ref 0.0–0.7)
Eosinophils Relative: 4 % (ref 0.0–5.0)
HCT: 37.7 % — ABNORMAL LOW (ref 39.0–52.0)
Hemoglobin: 12.9 g/dL — ABNORMAL LOW (ref 13.0–17.0)
Lymphocytes Relative: 32.5 % (ref 12.0–46.0)
Lymphs Abs: 2.8 K/uL (ref 0.7–4.0)
MCHC: 34.1 g/dL (ref 30.0–36.0)
MCV: 99 fl (ref 78.0–100.0)
Monocytes Absolute: 1 K/uL (ref 0.1–1.0)
Monocytes Relative: 12 % (ref 3.0–12.0)
Neutro Abs: 4.4 K/uL (ref 1.4–7.7)
Neutrophils Relative %: 50.6 % (ref 43.0–77.0)
Platelets: 166 K/uL (ref 150.0–400.0)
RBC: 3.81 Mil/uL — ABNORMAL LOW (ref 4.22–5.81)
RDW: 12.5 % (ref 11.5–15.5)
WBC: 8.7 K/uL (ref 4.0–10.5)

## 2023-11-04 LAB — COMPREHENSIVE METABOLIC PANEL WITH GFR
ALT: 21 U/L (ref 0–53)
AST: 29 U/L (ref 0–37)
Albumin: 4.2 g/dL (ref 3.5–5.2)
Alkaline Phosphatase: 153 U/L — ABNORMAL HIGH (ref 39–117)
BUN: 18 mg/dL (ref 6–23)
CO2: 24 meq/L (ref 19–32)
Calcium: 9.5 mg/dL (ref 8.4–10.5)
Chloride: 101 meq/L (ref 96–112)
Creatinine, Ser: 0.85 mg/dL (ref 0.40–1.50)
GFR: 94.64 mL/min (ref >60.00)
Glucose, Bld: 91 mg/dL (ref 70–99)
Potassium: 4.2 meq/L (ref 3.5–5.1)
Sodium: 135 meq/L (ref 135–145)
Total Bilirubin: 0.6 mg/dL (ref 0.2–1.2)
Total Protein: 9 g/dL — ABNORMAL HIGH (ref 6.0–8.3)

## 2023-11-04 LAB — PROTIME-INR
INR: 1.5 ratio — ABNORMAL HIGH (ref 0.8–1.0)
Prothrombin Time: 15.2 s — ABNORMAL HIGH (ref 9.6–13.1)

## 2023-11-04 MED ORDER — MELOXICAM 7.5 MG PO TABS
7.5000 mg | ORAL_TABLET | Freq: Every day | ORAL | 1 refills | Status: AC
  Filled 2023-11-04: qty 30, 30d supply, fill #0
  Filled 2023-12-09: qty 30, 30d supply, fill #1

## 2023-11-04 MED ORDER — NICOTINE 7 MG/24HR TD PT24
7.0000 mg | MEDICATED_PATCH | Freq: Every day | TRANSDERMAL | 3 refills | Status: AC
  Filled 2023-11-04 (×2): qty 14, 14d supply, fill #0
  Filled 2023-12-09: qty 28, 28d supply, fill #1

## 2023-11-04 NOTE — Progress Notes (Signed)
 Subjective:  Patient ID: Danny Carlson, male    DOB: 10-Aug-1965  Age: 58 y.o. MRN: [id number]  CC: Medical Management of Chronic Issues     Discussed the use of AI scribe software for clinical note transcription with the patient, who gave verbal consent to proceed.  History of Present Illness Dairon Procter is a 58 year old male with hypertension, hepatitis C, osteoarthritis who presents for follow-up of elevated blood pressure.  He has not taken his blood pressure medication today due to rushing to the appointment. His current medications include amlodipine  and valsartan /hydrochlorothiazide . He smokes cigarettes, with a pack lasting three days, and smoked one cigarette before the appointment. He is attempting to quit smoking. He experiences arthritis pain in his elbows and knees but is not taking medication for it. He has not had blood tests since February.    Past Medical History:  Diagnosis Date   EtOH dependence (HCC)    Hypertension     No past surgical history on file.  Family History  Problem Relation Age of Onset   Cancer Father     Social History   Socioeconomic History   Marital status: Single    Spouse name: Not on file   Number of children: Not on file   Years of education: Not on file   Highest education level: Not on file  Occupational History   Not on file  Tobacco Use   Smoking status: Every Day    Current packs/day: 0.50    Average packs/day: 0.5 packs/day for 20.0 years (10.0 ttl pk-yrs)    Types: Cigarettes   Smokeless tobacco: Never  Vaping Use   Vaping status: Never Used  Substance and Sexual Activity   Alcohol use: Yes   Drug use: No   Sexual activity: Not Currently  Other Topics Concern   Not on file  Social History Narrative   Not on file   Social Drivers of Health   Financial Resource Strain: Not on file  Food Insecurity: Not on file  Transportation Needs: Not on file  Physical Activity: Not on file  Stress: Not on  file  Social Connections: Not on file    No Known Allergies  Outpatient Medications Prior to Visit  Medication Sig Dispense Refill   amLODipine  (NORVASC ) 10 MG tablet Take 1 tablet (10 mg total) by mouth daily. 90 tablet 2   cetirizine  (ZYRTEC ) 10 MG tablet Take 1 tablet (10 mg total) by mouth daily as needed for itching. 30 tablet 11   Clindamycin -Benzoyl Per, Refr, gel Apply topically 2 (two) times daily. 45 g 0   doxepin  (SINEQUAN ) 150 MG capsule Take 1 capsule (150 mg total) by mouth at bedtime as needed. 60 capsule 2   fluticasone  (FLONASE ) 50 MCG/ACT nasal spray Place 2 sprays into both nostrils daily. 16 g 6   hydrOXYzine  (ATARAX ) 10 MG tablet Take 1 tablet (10 mg total) by mouth 3 (three) times daily as needed for itching. 60 tablet 2   Sofosbuvir -Velpatasvir  (EPCLUSA ) 400-100 MG TABS Take 1 tablet by mouth daily. 28 tablet 2   triamcinolone  cream (KENALOG ) 0.1 % Apply 1 Application topically 2 (two) times daily. 80 g 0   valsartan -hydrochlorothiazide  (DIOVAN -HCT) 160-25 MG tablet Take 1 tablet by mouth daily. 90 tablet 3   No facility-administered medications prior to visit.     ROS Review of Systems  Constitutional:  Negative for activity change and appetite change.  HENT:  Negative for sinus pressure and sore throat.  Respiratory:  Negative for chest tightness, shortness of breath and wheezing.   Cardiovascular:  Negative for chest pain and palpitations.  Gastrointestinal:  Negative for abdominal distention, abdominal pain and constipation.  Genitourinary: Negative.   Musculoskeletal:        See HPI  Psychiatric/Behavioral:  Negative for behavioral problems and dysphoric mood.     Objective:  BP (!) 142/70   Pulse 65   Ht 5' 4 (1.626 m)   Wt 143 lb 9.6 oz (65.1 kg)   SpO2 99%   BMI 24.65 kg/m      11/04/2023    9:04 AM 11/04/2023    8:44 AM 09/18/2023    9:14 AM  BP/Weight  Systolic BP 142 153 146  Diastolic BP 70 67 82  Wt. (Lbs)  143.6   BMI  24.65  kg/m2       Physical Exam Constitutional:      Appearance: He is well-developed.  Cardiovascular:     Rate and Rhythm: Normal rate.     Heart sounds: Normal heart sounds. No murmur heard. Pulmonary:     Effort: Pulmonary effort is normal.     Breath sounds: Normal breath sounds. No wheezing or rales.  Chest:     Chest wall: No tenderness.  Abdominal:     General: Bowel sounds are normal. There is no distension.     Palpations: Abdomen is soft. There is no mass.     Tenderness: There is no abdominal tenderness.  Musculoskeletal:        General: Normal range of motion.     Right lower leg: No edema.     Left lower leg: No edema.  Neurological:     Mental Status: He is alert and oriented to person, place, and time.  Psychiatric:        Mood and Affect: Mood normal.        Latest Ref Rng & Units 05/28/2023    9:33 AM 05/07/2023   10:37 AM 06/05/2022   10:12 AM  CMP  Glucose 70 - 99 mg/dL  90  88   BUN 6 - 24 mg/dL  13  12   Creatinine 9.23 - 1.27 mg/dL  9.02  9.18   Sodium 865 - 144 mmol/L  136  138   Potassium 3.5 - 5.2 mmol/L  5.1  4.2   Chloride 96 - 106 mmol/L  103  101   CO2 20 - 29 mmol/L  21  24   Calcium 8.7 - 10.2 mg/dL  8.9  9.0   Total Protein 6.1 - 8.1 g/dL 8.1  7.7  8.3   Total Bilirubin 0.2 - 1.2 mg/dL 0.7  0.7  1.4   Alkaline Phos 44 - 121 IU/L  149  137   AST 10 - 35 U/L 82  97  83   ALT 9 - 46 U/L 9 - 46 U/L 60    61  57  65     Lipid Panel  No results found for: CHOL, TRIG, HDL, CHOLHDL, VLDL, LDLCALC, LDLDIRECT  CBC    Component Value Date/Time   WBC 6.5 05/07/2023 1037   WBC 7.0 01/23/2022 1529   RBC 3.00 (L) 05/07/2023 1037   RBC 3.44 (L) 01/23/2022 1529   HGB 10.4 (L) 05/07/2023 1037   HCT 28.8 (L) 05/07/2023 1037   PLT 182 05/07/2023 1037   MCV 96 05/07/2023 1037   MCH 34.7 (H) 05/07/2023 1037   MCH 35.8 (H) 01/23/2022 1529  MCHC 36.1 (H) 05/07/2023 1037   MCHC 35.1 01/23/2022 1529   RDW 10.7 (L) 05/07/2023 1037    LYMPHSABS 2.5 05/07/2023 1037   MONOABS 1.0 01/23/2022 1529   EOSABS 0.2 05/07/2023 1037   BASOSABS 0.1 05/07/2023 1037    Lab Results  Component Value Date   HGBA1C 5.0 06/05/2022       Assessment & Plan Hypertension Blood pressure elevated, likely due to missed medication. Slight improvement noted on repeat reading. - Continue amlodipine  and valsartan  hydrochlorothiazide . - Advise taking medication before appointments. -Counseled on blood pressure goal of less than 130/80, low-sodium, DASH diet, medication compliance, 150 minutes of moderate intensity exercise per week. Discussed medication compliance, adverse effects.   Nicotine  dependence, cigarettes Smokes one pack every three days, attempting cessation. - Prescribe nicotine  patches.  Osteoarthritis, multiple sites Arthritis in elbows and knees with occasional pain, unmanaged. - Prescribe meloxicam .   Hepatitis C Currently on Epclusa  Follow-up with infectious disease  General Health Maintenance Colonoscopy due for cancer screening. - Refer for colonoscopy. - Order blood tests.    Healthcare maintenance Screening for colon cancer Referred for colonoscopy  Meds ordered this encounter  Medications   nicotine  (NICODERM CQ ) 7 mg/24hr patch    Sig: Place 1 patch (7 mg total) onto the skin daily.    Dispense:  28 patch    Refill:  3   meloxicam  (MOBIC ) 7.5 MG tablet    Sig: Take 1 tablet (7.5 mg total) by mouth daily.    Dispense:  30 tablet    Refill:  1    Follow-up: Return in about 6 months (around 05/06/2024) for Chronic medical conditions.       Corrina Sabin, MD, FAAFP. [facility] [place], [place] [phone]   11/04/2023, 9:57 AM

## 2023-11-04 NOTE — Patient Instructions (Signed)
 Your provider has requested that you go to the basement level for lab work before leaving today. Press B on the elevator. The lab is located at the first door on the left as you exit the elevator.  We have sent the following medications to your pharmacy for you to pick up at your convenience: Suprep   You have been scheduled for an endoscopy and colonoscopy. Please follow the written instructions given to you at your visit today.  If you use inhalers (even only as needed), please bring them with you on the day of your procedure.  DO NOT TAKE 7 DAYS PRIOR TO TEST- Trulicity (dulaglutide) Ozempic, Wegovy (semaglutide) Mounjaro (tirzepatide) Bydureon Bcise (exanatide extended release)  DO NOT TAKE 1 DAY PRIOR TO YOUR TEST Rybelsus (semaglutide) Adlyxin (lixisenatide) Victoza (liraglutide) Byetta (exanatide) ___________________________________________________________________________  _______________________________________________________  If your blood pressure at your visit was 140/90 or greater, please contact your primary care physician to follow up on this.  _______________________________________________________  If you are age 60 or older, your body mass index should be between 23-30. Your Body mass index is 24.27 kg/m. If this is out of the aforementioned range listed, please consider follow up with your Primary Care Provider.  If you are age 60 or younger, your body mass index should be between 19-25. Your Body mass index is 24.27 kg/m. If this is out of the aformentioned range listed, please consider follow up with your Primary Care Provider.   ________________________________________________________  The [facility] providers would like to encourage you to use MYCHART to communicate with providers for non-urgent requests or questions.  Due to long hold times on the telephone, sending your provider a message by [facility] may be a faster and more efficient way to get a  response.  Please allow 48 business hours for a response.  Please remember that this is for non-urgent requests.  _______________________________________________________  [facility] is using a team-based approach to care.  Your team is made up of your doctor and two to three APPS. Our APPS (Nurse Practitioners and Physician Assistants) work with your physician to ensure care continuity for you. They are fully qualified to address your health concerns and develop a treatment plan. They communicate directly with your gastroenterologist to care for you. Seeing the Advanced Practice Practitioners on your physician's team can help you by facilitating care more promptly, often allowing for earlier appointments, access to diagnostic testing, procedures, and other specialty referrals.   Thank you for choosing me and  Gastroenterology.  Delon Failing, PA-C

## 2023-11-04 NOTE — Patient Instructions (Signed)
 Hypertension, Adult High blood pressure (hypertension) is when the force of blood pumping through the arteries is too strong. The arteries are the blood vessels that carry blood from the heart throughout the body. Hypertension forces the heart to work harder to pump blood and may cause arteries to become narrow or stiff. Untreated or uncontrolled hypertension can lead to a heart attack, heart failure, a stroke, kidney disease, and other problems. A blood pressure reading consists of a higher number over a lower number. Ideally, your blood pressure should be below 120/80. The first ("top") number is called the systolic pressure. It is a measure of the pressure in your arteries as your heart beats. The second ("bottom") number is called the diastolic pressure. It is a measure of the pressure in your arteries as the heart relaxes. What are the causes? The exact cause of this condition is not known. There are some conditions that result in high blood pressure. What increases the risk? Certain factors may make you more likely to develop high blood pressure. Some of these risk factors are under your control, including: Smoking. Not getting enough exercise or physical activity. Being overweight. Having too much fat, sugar, calories, or salt (sodium) in your diet. Drinking too much alcohol. Other risk factors include: Having a personal history of heart disease, diabetes, high cholesterol, or kidney disease. Stress. Having a family history of high blood pressure and high cholesterol. Having obstructive sleep apnea. Age. The risk increases with age. What are the signs or symptoms? High blood pressure may not cause symptoms. Very high blood pressure (hypertensive crisis) may cause: Headache. Fast or irregular heartbeats (palpitations). Shortness of breath. Nosebleed. Nausea and vomiting. Vision changes. Severe chest pain, dizziness, and seizures. How is this diagnosed? This condition is diagnosed by  measuring your blood pressure while you are seated, with your arm resting on a flat surface, your legs uncrossed, and your feet flat on the floor. The cuff of the blood pressure monitor will be placed directly against the skin of your upper arm at the level of your heart. Blood pressure should be measured at least twice using the same arm. Certain conditions can cause a difference in blood pressure between your right and left arms. If you have a high blood pressure reading during one visit or you have normal blood pressure with other risk factors, you may be asked to: Return on a different day to have your blood pressure checked again. Monitor your blood pressure at home for 1 week or longer. If you are diagnosed with hypertension, you may have other blood or imaging tests to help your health care provider understand your overall risk for other conditions. How is this treated? This condition is treated by making healthy lifestyle changes, such as eating healthy foods, exercising more, and reducing your alcohol intake. You may be referred for counseling on a healthy diet and physical activity. Your health care provider may prescribe medicine if lifestyle changes are not enough to get your blood pressure under control and if: Your systolic blood pressure is above 130. Your diastolic blood pressure is above 80. Your personal target blood pressure may vary depending on your medical conditions, your age, and other factors. Follow these instructions at home: Eating and drinking  Eat a diet that is high in fiber and potassium, and low in sodium, added sugar, and fat. An example of this eating plan is called the DASH diet. DASH stands for Dietary Approaches to Stop Hypertension. To eat this way: Eat  plenty of fresh fruits and vegetables. Try to fill one half of your plate at each meal with fruits and vegetables. Eat whole grains, such as whole-wheat pasta, brown rice, or whole-grain bread. Fill about one  fourth of your plate with whole grains. Eat or drink low-fat dairy products, such as skim milk or low-fat yogurt. Avoid fatty cuts of meat, processed or cured meats, and poultry with skin. Fill about one fourth of your plate with lean proteins, such as fish, chicken without skin, beans, eggs, or tofu. Avoid pre-made and processed foods. These tend to be higher in sodium, added sugar, and fat. Reduce your daily sodium intake. Many people with hypertension should eat less than 1,500 mg of sodium a day. Do not drink alcohol if: Your health care provider tells you not to drink. You are pregnant, may be pregnant, or are planning to become pregnant. If you drink alcohol: Limit how much you have to: 0-1 drink a day for women. 0-2 drinks a day for men. Know how much alcohol is in your drink. In the [place] one drink equals one 12 oz bottle of beer (355 mL), one 5 oz glass of wine (148 mL), or one 1 oz glass of hard liquor (44 mL). Lifestyle  Work with your health care provider to maintain a healthy body weight or to lose weight. Ask what an ideal weight is for you. Get at least 30 minutes of exercise that causes your heart to beat faster (aerobic exercise) most days of the week. Activities may include walking, swimming, or biking. Include exercise to strengthen your muscles (resistance exercise), such as Pilates or lifting weights, as part of your weekly exercise routine. Try to do these types of exercises for 30 minutes at least 3 days a week. Do not use any products that contain nicotine or tobacco. These products include cigarettes, chewing tobacco, and vaping devices, such as e-cigarettes. If you need help quitting, ask your health care provider. Monitor your blood pressure at home as told by your health care provider. Keep all follow-up visits. This is important. Medicines Take over-the-counter and prescription medicines only as told by your health care provider. Follow directions carefully. Blood  pressure medicines must be taken as prescribed. Do not skip doses of blood pressure medicine. Doing this puts you at risk for problems and can make the medicine less effective. Ask your health care provider about side effects or reactions to medicines that you should watch for. Contact a health care provider if you: Think you are having a reaction to a medicine you are taking. Have headaches that keep coming back (recurring). Feel dizzy. Have swelling in your ankles. Have trouble with your vision. Get help right away if you: Develop a severe headache or confusion. Have unusual weakness or numbness. Feel faint. Have severe pain in your chest or abdomen. Vomit repeatedly. Have trouble breathing. These symptoms may be an emergency. Get help right away. Call 911. Do not wait to see if the symptoms will go away. Do not drive yourself to the hospital. Summary Hypertension is when the force of blood pumping through your arteries is too strong. If this condition is not controlled, it may put you at risk for serious complications. Your personal target blood pressure may vary depending on your medical conditions, your age, and other factors. For most people, a normal blood pressure is less than 120/80. Hypertension is treated with lifestyle changes, medicines, or a combination of both. Lifestyle changes include losing weight, eating a healthy,  low-sodium diet, exercising more, and limiting alcohol. This information is not intended to replace advice given to you by your health care provider. Make sure you discuss any questions you have with your health care provider. Document Revised: 01/01/2021 Document Reviewed: 01/01/2021 Elsevier Patient Education  2024 [employer].

## 2023-11-04 NOTE — Telephone Encounter (Signed)
 error

## 2023-11-04 NOTE — Progress Notes (Signed)
 Chief Complaint: Cirrhosis  HPI:    [name] is a 58 year old African-American male with a past medical history as listed below including hepatitis C recently treated with Epclusa , alcohol dependence, who was referred to me by Brien Belvie BRAVO, MD for a complaint of cirrhosis.      01/23/2022 CT of the chest abdomen pelvis with contrast done for delirium and AMS with facial trauma no blunt fall.  As far as hepatobiliary noted not enlarged, no focal lesion, no laceration or subcapsular hematoma.  Calcified gallstones within the gallbladder.  No gallbladder wall thickening.  Normal pancreatic contour.    05/07/2023 CMP with an alk phos of 149, AST 97, ALT 57 (LFTs have been elevated since 2018 and similar pattern.  Sodium normal at 136.  CBC with a hemoglobin of 10.4, MCV normal.  Platelets 182.  Hep C antibody reactive.    05/28/23 hepatic function panel with a direct bili minimally elevated 0.3, alk phos 167, AST 82, ALT 60.  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  INR 1.3, PT 13.5.  Hep B negative.  aFP 56.    06/19/23 ultrasound of the elastography done for initial evaluation of hepatitis C with an elevated AFP.  Median K PA was 19.6.  (This is highly suggestive of CA CLD with an increased probability of clinically significant portal hypertension)    09/14/2023 HCVRNA by PCR not detected.  HCV quant not detected.    09/14/2023 follow-up with infectious disease for hepatitis C.  It started Epclusa  month prior.  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    Today, patient presents to clinic and explains that he was never told that he has cirrhosis, but he has been following with infectious disease for treatment and is aware he has a follow-up appointment on September 9.  Tells me in general he feels well.  Denies any previous IV drug use.  Tells me he is not sure how he got hepatitis C.  He does admit to being a very heavy drinker  until about 6 months ago and now he has a beer every now and then.  Tells me that he is currently homeless and living in the shelter.  He is trying to take care of himself.  Denies any GI complaints or concerns.  No prior screening for colon cancer.    Denies fever, chills or weight loss, no prior abdominal distention or peripheral edema.  Past Medical History:  Diagnosis Date   EtOH dependence (HCC)    Hypertension     No past surgical history on file.  Current Outpatient Medications  Medication Sig Dispense Refill   amLODipine  (NORVASC ) 10 MG tablet Take 1 tablet (10 mg total) by mouth daily. 90 tablet 2   cetirizine  (ZYRTEC ) 10 MG tablet Take 1 tablet (10 mg total) by mouth daily as needed for itching. 30 tablet 11   Clindamycin -Benzoyl Per, Refr, gel Apply topically 2 (two) times daily. 45 g 0   doxepin  (SINEQUAN ) 150 MG capsule Take 1 capsule (150 mg total) by mouth at bedtime as needed. 60 capsule 2   fluticasone  (FLONASE ) 50 MCG/ACT nasal spray Place 2 sprays into both nostrils daily. 16 g 6   hydrOXYzine  (ATARAX ) 10 MG tablet Take 1 tablet (10 mg total) by mouth 3 (three) times daily as needed for itching. 60 tablet 2   meloxicam  (MOBIC ) 7.5 MG tablet Take 1 tablet (7.5 mg total) by  mouth daily. 30 tablet 1   nicotine  (NICODERM CQ ) 7 mg/24hr patch Place 1 patch (7 mg total) onto the skin daily. 28 patch 3   Sofosbuvir -Velpatasvir  (EPCLUSA ) 400-100 MG TABS Take 1 tablet by mouth daily. 28 tablet 2   triamcinolone  cream (KENALOG ) 0.1 % Apply 1 Application topically 2 (two) times daily. 80 g 0   valsartan -hydrochlorothiazide  (DIOVAN -HCT) 160-25 MG tablet Take 1 tablet by mouth daily. 90 tablet 3   No current facility-administered medications for this visit.    Allergies as of 11/04/2023   (No Known Allergies)    Family History  Problem Relation Age of Onset   Cancer Father     Social History   Socioeconomic History   Marital status: Single    Spouse name: Not on file    Number of children: Not on file   Years of education: Not on file   Highest education level: Not on file  Occupational History   Not on file  Tobacco Use   Smoking status: Every Day    Current packs/day: 0.50    Average packs/day: 0.5 packs/day for 20.0 years (10.0 ttl pk-yrs)    Types: Cigarettes   Smokeless tobacco: Never  Vaping Use   Vaping status: Never Used  Substance and Sexual Activity   Alcohol use: Yes   Drug use: No   Sexual activity: Not Currently  Other Topics Concern   Not on file  Social History Narrative   Not on file   Social Drivers of Health   Financial Resource Strain: Not on file  Food Insecurity: Not on file  Transportation Needs: Not on file  Physical Activity: Not on file  Stress: Not on file  Social Connections: Not on file  Intimate Partner Violence: Not on file    Review of Systems:    Constitutional: No weight loss, fever or chills Skin: No rash Cardiovascular: No chest pain Respiratory: No SOB Gastrointestinal: See HPI and otherwise negative Genitourinary: No dysuria Neurological: No headache, dizziness or syncope Musculoskeletal: No new muscle or joint pain Hematologic: No bleeding Psychiatric: No history of depression or anxiety   Physical Exam:  Vital signs: BP 130/68   Pulse 75   Ht 5' 4 (1.626 m)   Wt 141 lb 6 oz (64.1 kg)   BMI 24.27 kg/m    Constitutional:   Pleasant AA male appears to be in NAD, Well developed, Well nourished, alert and cooperative Head:  Normocephalic and atraumatic. Eyes:   PEERL, EOMI. No icterus. Conjunctiva pink. Ears:  Normal auditory acuity. Neck:  Supple Throat: Oral cavity and pharynx without inflammation, swelling or lesion.  Respiratory: Respirations even and unlabored. Lungs clear to auscultation bilaterally.   No wheezes, crackles, or rhonchi.  Cardiovascular: Normal S1, S2. No MRG. Regular rate and rhythm. No peripheral edema, cyanosis or pallor.  Gastrointestinal:  Soft,  nondistended, nontender. No rebound or guarding. Normal bowel sounds. No appreciable masses or hepatomegaly. Rectal:  Not performed.  Msk:  Symmetrical without gross deformities. Without edema, no deformity or joint abnormality.  Neurologic:  Alert and  oriented x4;  grossly normal neurologically.  Skin:   Dry and intact without significant lesions or rashes. Psychiatric: Demonstrates good judgement and reason without abnormal affect or behaviors.  MOST RECENT LABS AND IMAGING: CBC    Component Value Date/Time   WBC 6.5 05/07/2023 1037   WBC 7.0 01/23/2022 1529   RBC 3.00 (L) 05/07/2023 1037   RBC 3.44 (L) 01/23/2022 1529   HGB 10.4 (L)  05/07/2023 1037   HCT 28.8 (L) 05/07/2023 1037   PLT 182 05/07/2023 1037   MCV 96 05/07/2023 1037   MCH 34.7 (H) 05/07/2023 1037   MCH 35.8 (H) 01/23/2022 1529   MCHC 36.1 (H) 05/07/2023 1037   MCHC 35.1 01/23/2022 1529   RDW 10.7 (L) 05/07/2023 1037   LYMPHSABS 2.5 05/07/2023 1037   MONOABS 1.0 01/23/2022 1529   EOSABS 0.2 05/07/2023 1037   BASOSABS 0.1 05/07/2023 1037    CMP     Component Value Date/Time   NA 136 05/07/2023 1037   K 5.1 05/07/2023 1037   CL 103 05/07/2023 1037   CO2 21 05/07/2023 1037   GLUCOSE 90 05/07/2023 1037   GLUCOSE 122 (H) 01/23/2022 1654   BUN 13 05/07/2023 1037   CREATININE 0.97 05/07/2023 1037   CALCIUM 8.9 05/07/2023 1037   PROT 8.1 05/28/2023 0933   PROT 7.7 05/07/2023 1037   ALBUMIN 3.3 (L) 05/07/2023 1037   AST 82 (H) 05/28/2023 0933   ALT 60 (H) 05/28/2023 0933   ALT 61 (H) 05/28/2023 0933   ALKPHOS 149 (H) 05/07/2023 1037   BILITOT 0.7 05/28/2023 0933   BILITOT 0.7 05/07/2023 1037   GFRNONAA >60 01/23/2022 1529   GFRAA >60 04/24/2016 1637    Assessment: 1.  Cirrhosis: Does have history of alcohol dependence, has limited alcohol consumption for the past 6 months, but also hep C recently treated by infectious disease with Epclusa , LFTs have been elevated for at least the past 7 years, no prior  EGD or GI evaluation, fibrosis testing showed fibrosis stage F4 by lab work; likely alcohol or hep C related versus less likely autoimmune cause 2.  Alcohol dependence: Previously drank liquor every day, now having a beer every once in a while per him the past 6 months 3.  Hepatitis C: Currently undergoing treatment with infectious disease on Epclusa , most recent viral load was on detectable 4.  Homelessness: Currently living in shelter  Plan: 1.  Scheduled patient for an EGD for variceal screening and a screening colonoscopy for colon cancer screening.  Did provide the patient with a detailed list of risk for the procedures and he agrees to proceed. Patient is appropriate for endoscopic procedure(s) in the ambulatory ([facility]) setting.  These were scheduled with Dr. Legrand. 2.  Continue to follow with infectious disease in regards to hep C treatment 3.  Repeat labs today, CMP done earlier but has not resulted by an outside clinic, repeat CBC and PT/INR today for thoroughness, though cirrhosis is likely caused by hep C +/- alcohol will add on autoimmune workup today including ANA, ASMA, alpha-1 antitrypsin and AMA 4.  Recent ultrasound and AFP in April, will be due for repeat in October for [facility] screening. 5.  Reviewed low-sodium diet and weight monitoring 6.  Recommended alcohol cessation 7.  Patient to follow in clinic per recommendations from Dr. Legrand after time of procedures  Danny Failing, PA-C [facility] 11/04/2023, 12:25 PM  Cc: Brien Belvie BRAVO, MD

## 2023-11-05 ENCOUNTER — Ambulatory Visit: Payer: Self-pay | Admitting: Family Medicine

## 2023-11-05 LAB — CMP14+EGFR
ALT: 21 IU/L (ref 0–44)
AST: 32 IU/L (ref 0–40)
Albumin: 4.2 g/dL (ref 3.8–4.9)
Alkaline Phosphatase: 194 IU/L — ABNORMAL HIGH (ref 44–121)
BUN/Creatinine Ratio: 19 (ref 10–24)
BUN: 18 mg/dL (ref 8–27)
Bilirubin Total: 0.5 mg/dL (ref 0.0–1.2)
CO2: 21 mmol/L (ref 20–29)
Calcium: 10 mg/dL (ref 8.6–10.2)
Chloride: 101 mmol/L (ref 96–106)
Creatinine, Ser: 0.93 mg/dL (ref 0.76–1.27)
Globulin, Total: 4.4 g/dL (ref 1.5–4.5)
Glucose: 93 mg/dL (ref 70–99)
Potassium: 4.4 mmol/L (ref 3.5–5.2)
Sodium: 138 mmol/L (ref 134–144)
Total Protein: 8.6 g/dL — ABNORMAL HIGH (ref 6.0–8.5)
eGFR: 94 mL/min/1.73 (ref >59)

## 2023-11-05 NOTE — Progress Notes (Signed)
 ____________________________________________________________  Attending physician addendum:  Thank you for sending this case to me. I have reviewed the entire note and agree with the plan, with the following thoughts:  1 -we need to be sure that this unhoused patient living in a shelter as a sufficient care partner the day of his endoscopic procedure   -His alpha-fetoprotein of 56 in March of this year is worrisome for the possibility of hepatocellular carcinoma in a patient with cirrhosis. While he had an ultrasound with elastography in April of this year, he has not had a cross-sectional imaging study of the abdomen for further evaluation of the elevated AFP to sufficiently rule out HCC.  Therefore, he needs a CT scan of the abdomen with and without contrast for the indication of cirrhosis and elevated alpha-fetoprotein.  Hopefully he will be able to follow through on that once we schedule it.  Victory Brand, MD  ____________________________________________________________

## 2023-11-06 ENCOUNTER — Telehealth: Payer: Self-pay

## 2023-11-06 ENCOUNTER — Other Ambulatory Visit: Payer: Self-pay

## 2023-11-06 DIAGNOSIS — R772 Abnormality of alphafetoprotein: Secondary | ICD-10-CM

## 2023-11-06 DIAGNOSIS — K7460 Unspecified cirrhosis of liver: Secondary | ICD-10-CM

## 2023-11-06 NOTE — Telephone Encounter (Signed)
-----   Message from Danny Carlson sent at 11/06/2023 11:02 AM EDT ----- Patient needs a CT scan of the abdomen with and without contrast for the indication of cirrhosis and elevated alpha-fetoprotein to be sure no masses in the liver.  Also, per Dr. Legrand, please confirm that he will have a care partner the day of his procedure.  Thank you,  Jess ----- Message ----- From: Legrand Victory LITTIE DOUGLAS, MD Sent: 11/05/2023   4:12 PM EDT To: Delon Hendricks Failing, PA

## 2023-11-06 NOTE — Telephone Encounter (Signed)
 Spoke with the patient. He agrees to the plan of care and will expect a call from the Radiology Scheduling staff. He plans to have his sister Holli Lesches or his case worker come with him to his procedure.

## 2023-11-12 ENCOUNTER — Ambulatory Visit: Payer: Self-pay | Admitting: Physician Assistant

## 2023-11-12 LAB — ANA: Anti Nuclear Antibody (ANA): NEGATIVE

## 2023-11-12 LAB — ALPHA-1-ANTITRYPSIN: A-1 Antitrypsin, Ser: 224 mg/dL — ABNORMAL HIGH (ref 83–199)

## 2023-11-12 LAB — ANTI-SMOOTH MUSCLE ANTIBODY, IGG: Actin (Smooth Muscle) Antibody (IGG): <20 U (ref <20)

## 2023-11-12 LAB — MITOCHONDRIAL ANTIBODIES: Mitochondrial M2 Ab, IgG: <=20 U (ref <=20.0)

## 2023-11-13 ENCOUNTER — Ambulatory Visit (HOSPITAL_COMMUNITY): Admission: RE | Admit: 2023-11-13 | Payer: MEDICAID | Source: Ambulatory Visit

## 2023-11-17 ENCOUNTER — Ambulatory Visit: Payer: MEDICAID | Admitting: Family

## 2023-11-24 ENCOUNTER — Ambulatory Visit (HOSPITAL_COMMUNITY): Payer: MEDICAID

## 2023-11-25 ENCOUNTER — Ambulatory Visit (HOSPITAL_COMMUNITY)
Admission: RE | Admit: 2023-11-25 | Discharge: 2023-11-25 | Disposition: A | Discharge status: Home / Self Care | Payer: MEDICAID | Source: Ambulatory Visit | Attending: Physician Assistant | Admitting: Physician Assistant

## 2023-11-25 DIAGNOSIS — K7460 Unspecified cirrhosis of liver: Secondary | ICD-10-CM | POA: Diagnosis present

## 2023-11-25 DIAGNOSIS — R772 Abnormality of alphafetoprotein: Secondary | ICD-10-CM | POA: Diagnosis present

## 2023-11-25 MED ORDER — IOHEXOL 300 MG/ML  SOLN
100.0000 mL | Freq: Once | INTRAMUSCULAR | Status: AC | PRN
  Administered 2023-11-25: 100 mL via INTRAVENOUS

## 2023-11-26 ENCOUNTER — Encounter: Payer: Self-pay | Admitting: Gastroenterology

## 2023-11-26 ENCOUNTER — Encounter: Payer: MEDICAID | Admitting: Gastroenterology

## 2023-11-26 ENCOUNTER — Other Ambulatory Visit: Payer: Self-pay

## 2023-12-02 ENCOUNTER — Ambulatory Visit: Payer: Self-pay | Admitting: Physician Assistant

## 2023-12-09 ENCOUNTER — Other Ambulatory Visit: Payer: Self-pay

## 2023-12-09 ENCOUNTER — Ambulatory Visit: Payer: MEDICAID | Admitting: Family

## 2023-12-23 ENCOUNTER — Encounter: Payer: MEDICAID | Admitting: Gastroenterology

## 2023-12-28 ENCOUNTER — Ambulatory Visit (AMBULATORY_SURGERY_CENTER): Payer: MEDICAID

## 2023-12-28 ENCOUNTER — Other Ambulatory Visit: Payer: Self-pay

## 2023-12-28 VITALS — Ht 64.0 in | Wt 144.6 lb

## 2023-12-28 DIAGNOSIS — K7460 Unspecified cirrhosis of liver: Secondary | ICD-10-CM

## 2023-12-28 DIAGNOSIS — Z1211 Encounter for screening for malignant neoplasm of colon: Secondary | ICD-10-CM

## 2023-12-28 MED ORDER — PEG 3350-KCL-NA BICARB-NACL 420 G PO SOLR
4000.0000 mL | Freq: Once | ORAL | 0 refills | Status: AC
  Filled 2023-12-28 (×2): qty 4000, 1d supply, fill #0

## 2023-12-28 NOTE — Progress Notes (Signed)

## 2023-12-31 ENCOUNTER — Other Ambulatory Visit: Payer: Self-pay

## 2024-01-01 ENCOUNTER — Other Ambulatory Visit: Payer: Self-pay

## 2024-01-08 ENCOUNTER — Other Ambulatory Visit (INDEPENDENT_AMBULATORY_CARE_PROVIDER_SITE_OTHER): Payer: MEDICAID

## 2024-01-08 ENCOUNTER — Encounter: Payer: Self-pay | Admitting: Gastroenterology

## 2024-01-08 ENCOUNTER — Other Ambulatory Visit: Payer: Self-pay | Admitting: *Deleted

## 2024-01-08 ENCOUNTER — Other Ambulatory Visit: Payer: Self-pay

## 2024-01-08 ENCOUNTER — Ambulatory Visit (AMBULATORY_SURGERY_CENTER): Payer: MEDICAID | Admitting: Gastroenterology

## 2024-01-08 VITALS — BP 112/68 | HR 84 | Temp 99.5°F | Resp 14 | Ht 64.0 in | Wt 144.6 lb

## 2024-01-08 DIAGNOSIS — D122 Benign neoplasm of ascending colon: Secondary | ICD-10-CM | POA: Diagnosis not present

## 2024-01-08 DIAGNOSIS — K7460 Unspecified cirrhosis of liver: Secondary | ICD-10-CM | POA: Diagnosis not present

## 2024-01-08 DIAGNOSIS — I8500 Esophageal varices without bleeding: Secondary | ICD-10-CM

## 2024-01-08 DIAGNOSIS — D128 Benign neoplasm of rectum: Secondary | ICD-10-CM

## 2024-01-08 DIAGNOSIS — K2950 Unspecified chronic gastritis without bleeding: Secondary | ICD-10-CM | POA: Diagnosis not present

## 2024-01-08 DIAGNOSIS — K3189 Other diseases of stomach and duodenum: Secondary | ICD-10-CM

## 2024-01-08 DIAGNOSIS — K2951 Unspecified chronic gastritis with bleeding: Secondary | ICD-10-CM

## 2024-01-08 DIAGNOSIS — Z1211 Encounter for screening for malignant neoplasm of colon: Secondary | ICD-10-CM

## 2024-01-08 DIAGNOSIS — R772 Abnormality of alphafetoprotein: Secondary | ICD-10-CM

## 2024-01-08 DIAGNOSIS — K648 Other hemorrhoids: Secondary | ICD-10-CM | POA: Diagnosis not present

## 2024-01-08 DIAGNOSIS — B182 Chronic viral hepatitis C: Secondary | ICD-10-CM

## 2024-01-08 MED ORDER — SODIUM CHLORIDE 0.9 % IV SOLN: 500.0000 mL | Freq: Once | INTRAVENOUS | Status: DC

## 2024-01-08 NOTE — Progress Notes (Signed)
 Report given to PACU, vss

## 2024-01-08 NOTE — Op Note (Signed)
 [facility] Patient Name: Danny Carlson Procedure Date: 01/08/2024 3:00 PM MRN: [id number] Endoscopist: Victory L. Legrand , MD, [id number] Age: 58 Referring MD:  Date of Birth: 02-Apr-1965 Gender: Male Account #: [id number] Procedure:                Colonoscopy Indications:              Screening for colorectal malignant neoplasm, This                            is the patient's first colonoscopy Medicines:                Monitored Anesthesia Care Procedure:                Pre-Anesthesia Assessment:                           - Prior to the procedure, a History and Physical                            was performed, and patient medications and                            allergies were reviewed. The patient's tolerance of                            previous anesthesia was also reviewed. The risks                            and benefits of the procedure and the sedation                            options and risks were discussed with the patient.                            All questions were answered, and informed consent                            was obtained. Prior Anticoagulants: The patient has                            taken no anticoagulant or antiplatelet agents. ASA                            Grade Assessment: III - A patient with severe                            systemic disease. After reviewing the risks and                            benefits, the patient was deemed in satisfactory                            condition to undergo the procedure.  After obtaining informed consent, the colonoscope                            was passed under direct vision. Throughout the                            procedure, the patient's blood pressure, pulse, and                            oxygen saturations were monitored continuously. The                            Olympus Scope [id number] was introduced through the                            anus and advanced to  the the cecum, identified by                            appendiceal orifice and ileocecal valve. The                            colonoscopy was performed without difficulty. The                            patient tolerated the procedure well. The quality                            of the bowel preparation was good. The ileocecal                            valve, appendiceal orifice, and rectum were                            photographed. Scope In: 3:26:25 PM Scope Out: 3:47:19 PM Scope Withdrawal Time: 0 hours 15 minutes 46 seconds  Total Procedure Duration: 0 hours 20 minutes 54 seconds  Findings:                 The perianal and digital rectal examinations were                            normal.                           Repeat examination of right colon under NBI                            performed.                           Three sessile polyps were found in the rectum and                            ascending colon. The polyps were 3 to 5 mm in size.  These polyps were removed with a cold snare.                            Resection and retrieval were complete. (the                            diminutive more dstal rectal polyp may be                            hyperplastic)                           Internal hemorrhoids were found. The hemorrhoids                            were small.                           The exam was otherwise without abnormality on                            direct and retroflexion views. Complications:            No immediate complications. Estimated Blood Loss:     Estimated blood loss was minimal. Impression:               - Three 3 to 5 mm polyps in the rectum and in the                            ascending colon, removed with a cold snare.                            Resected and retrieved.                           - Internal hemorrhoids.                           - The examination was otherwise normal on direct                             and retroflexion views. Recommendation:           - Patient has a contact number available for                            emergencies. The signs and symptoms of potential                            delayed complications were discussed with the                            patient. Return to normal activities tomorrow.                            Written discharge instructions were provided to the  patient.                           - Resume previous diet.                           - Continue present medications.                           - Await pathology results.                           - Repeat colonoscopy is recommended for                            surveillance. The colonoscopy date will be                            determined after pathology results from today's                            exam become available for review.                           - See the other procedure note for documentation of                            additional recommendations. Lindalee Huizinga L. Legrand, MD 01/08/2024 3:50:47 PM This report has been signed electronically.

## 2024-01-08 NOTE — Progress Notes (Signed)
 History and Physical:  This patient presents for endoscopic testing for: Encounter Diagnoses  Name Primary?   Cirrhosis of liver without ascites, unspecified hepatic cirrhosis type (HCC) Yes   Special screening for malignant neoplasms, colon    Chronic hepatitis C without hepatic coma (HCC)     58 year old man here today for both a screening colonoscopy and upper endoscopic evaluation/screening for esophageal/gastric varices in the setting of cirrhosis from chronic hepatitis C and alcohol use.  Of note, this patient also had an elevated AFP of 56 in March of this year.   \CT abdomen and pelvis with and without contrast last month showed 3 subcentimeter liver lesions with recommendation from radiology to get a follow-up MRI in 3 to 6 months.    Patient is otherwise without complaints or active issues today.   Past Medical History: Past Medical History:  Diagnosis Date   Chronic hepatitis C (HCC)    Cirrhosis of liver (HCC)    EtOH dependence (HCC)    Hypertension      Past Surgical History: History reviewed. No pertinent surgical history.  Allergies: No Known Allergies  Outpatient Meds: Current Outpatient Medications  Medication Sig Dispense Refill   amLODipine  (NORVASC ) 10 MG tablet Take 1 tablet (10 mg total) by mouth daily. 90 tablet 2   cetirizine  (ZYRTEC ) 10 MG tablet Take 1 tablet (10 mg total) by mouth daily as needed for itching. 30 tablet 11   meloxicam  (MOBIC ) 7.5 MG tablet Take 1 tablet (7.5 mg total) by mouth daily. 30 tablet 1   valsartan -hydrochlorothiazide  (DIOVAN -HCT) 160-25 MG tablet Take 1 tablet by mouth daily. 90 tablet 3   Clindamycin -Benzoyl Per, Refr, gel Apply topically 2 (two) times daily. 45 g 0   doxepin  (SINEQUAN ) 150 MG capsule Take 1 capsule (150 mg total) by mouth at bedtime as needed. 60 capsule 2   fluticasone  (FLONASE ) 50 MCG/ACT nasal spray Place 2 sprays into both nostrils daily. 16 g 6   hydrOXYzine  (ATARAX ) 10 MG tablet Take 1 tablet  (10 mg total) by mouth 3 (three) times daily as needed for itching. 60 tablet 2   nicotine  (NICODERM CQ ) 7 mg/24hr patch Place 1 patch (7 mg total) onto the skin daily. 28 patch 3   Sofosbuvir -Velpatasvir  (EPCLUSA ) 400-100 MG TABS Take 1 tablet by mouth daily. (Patient not taking: Reported on 12/28/2023) 28 tablet 2   triamcinolone  cream (KENALOG ) 0.1 % Apply 1 Application topically 2 (two) times daily. 80 g 0   Current Facility-Administered Medications  Medication Dose Route Frequency Provider Last Rate Last Admin   0.9 %  sodium chloride  infusion  500 mL Intravenous Once Danis, Victory LITTIE MOULD, MD          ___________________________________________________________________ Objective   Exam:  BP (!) 149/66   Pulse (!) 103   Temp 99.5 F (37.5 C)   Ht 5' 4 (1.626 m)   Wt 144 lb 9.6 oz (65.6 kg)   SpO2 98%   BMI 24.82 kg/m   CV: regular , S1/S2 Resp: clear to auscultation bilaterally, normal RR and effort noted GI: soft, no tenderness, with active bowel sounds.   Assessment: Encounter Diagnoses  Name Primary?   Cirrhosis of liver without ascites, unspecified hepatic cirrhosis type (HCC) Yes   Special screening for malignant neoplasms, colon    Chronic hepatitis C without hepatic coma (HCC)     Data:    Latest Ref Rng & Units 11/04/2023    2:23 PM 05/07/2023   10:37 AM 06/05/2022  10:12 AM  CBC  WBC 4.0 - 10.5 K/uL 8.7  6.5  7.2   Hemoglobin 13.0 - 17.0 g/dL 87.0  89.5  87.3   Hematocrit 39.0 - 52.0 % 37.7  28.8  33.7   Platelets 150.0 - 400.0 K/uL 166.0  182  83       Latest Ref Rng & Units 11/04/2023    2:23 PM 11/04/2023    9:21 AM 05/28/2023    9:33 AM  CMP  Glucose 70 - 99 mg/dL 91  93    BUN 6 - 23 mg/dL 18  18    Creatinine 9.59 - 1.50 mg/dL 9.14  9.06    Sodium 864 - 145 mEq/L 135  138    Potassium 3.5 - 5.1 mEq/L 4.2  4.4    Chloride 96 - 112 mEq/L 101  101    CO2 19 - 32 mEq/L 24  21    Calcium 8.4 - 10.5 mg/dL 9.5  89.9    Total Protein 6.0 - 8.3  g/dL 9.0  8.6  8.1   Total Bilirubin 0.2 - 1.2 mg/dL 0.6  0.5  0.7   Alkaline Phos 39 - 117 U/L 153  194    AST 0 - 37 U/L 29  32  82   ALT 0 - 53 U/L 21  21  60    61    Lab Results  Component Value Date   INR 1.5 (H) 11/04/2023   INR 1.3 (H) 05/28/2023   MELD 3.0: 11 at 11/04/2023  2:23 PM MELD-Na: 11 at 11/04/2023  2:23 PM Calculated from: Serum Creatinine: 0.85 mg/dL (Using min of 1 mg/dL) at 1/72/7974  7:76 PM Serum Sodium: 135 mEq/L at 11/04/2023  2:23 PM Total Bilirubin: 0.6 mg/dL (Using min of 1 mg/dL) at 1/72/7974  7:76 PM Serum Albumin: 4.2 g/dL (Using max of 3.5 g/dL) at 1/72/7974  7:76 PM INR(ratio): 1.5 ratio at 11/04/2023  2:23 PM Age at listing (hypothetical): 58 years Sex: Male at 11/04/2023  2:23 PM    Plan: Colonoscopy EGD    The benefits and risks of the planned procedure(s) were described in detail with the patient or (when appropriate) their health care proxy.  Risks were outlined as including, but not limited to, bleeding, infection, perforation, adverse medication reaction leading to cardiac or pulmonary decompensation, pancreatitis (if ERCP).  The limitation of incomplete mucosal visualization was also discussed.  No guarantees or warranties were given.  In addition, I will arrange for him to go to our lab today for a repeat AFP.  With that result, we then decide about the timing of MRI liver.  The patient is appropriate for an endoscopic procedure in the ambulatory setting.   - Victory Brand, MD

## 2024-01-08 NOTE — Progress Notes (Signed)
 Called to room to assist during endoscopic procedure.  Patient ID and intended procedure confirmed with present staff. Received instructions for my participation in the procedure from the performing physician.

## 2024-01-08 NOTE — Progress Notes (Signed)
1512 Robinul 0.1 mg IV given due large amount of secretions upon assessment.  MD made aware, vss

## 2024-01-08 NOTE — Patient Instructions (Addendum)
 Resume previous diet  Continue present medications  Await pathology results  Repeat colonoscopy based on pathology results See handouts on polyps YOU HAD AN ENDOSCOPIC PROCEDURE TODAY AT THE [facility]:   Refer to the procedure report that was given to you for any specific questions about what was found during the examination.  If the procedure report does not answer your questions, please call your gastroenterologist to clarify.  If you requested that your care partner not be given the details of your procedure findings, then the procedure report has been included in a sealed envelope for you to review at your convenience later.  YOU SHOULD EXPECT: Some feelings of bloating in the abdomen. Passage of more gas than usual.  Walking can help get rid of the air that was put into your GI tract during the procedure and reduce the bloating. If you had a lower endoscopy (such as a colonoscopy or flexible sigmoidoscopy) you may notice spotting of blood in your stool or on the toilet paper. If you underwent a bowel prep for your procedure, you may not have a normal bowel movement for a few days.  Please Note:  You might notice some irritation and congestion in your nose or some drainage.  This is from the oxygen used during your procedure.  There is no need for concern and it should clear up in a day or so.  SYMPTOMS TO REPORT IMMEDIATELY: Following lower endoscopy (colonoscopy or flexible sigmoidoscopy):  Excessive amounts of blood in the stool  Significant tenderness or worsening of abdominal pains  Swelling of the abdomen that is new, acute  Fever of 100F or higher Following upper endoscopy (EGD)  Vomiting of blood or coffee ground material  New chest pain or pain under the shoulder blades  Painful or persistently difficult swallowing  New shortness of breath  Black, tarry-looking stools  For urgent or emergent issues, a gastroenterologist can be reached at any hour by calling [phone]  [phone]. Do not use MyChart messaging for urgent concerns.   DIET:  We do recommend a small meal at first, but then you may proceed to your regular diet.  Drink plenty of fluids but you should avoid alcoholic beverages for 24 hours.  ACTIVITY:  You should plan to take it easy for the rest of today and you should NOT DRIVE or use heavy machinery until tomorrow (because of the sedation medicines used during the test).    FOLLOW UP: Our staff will call the number listed on your records the next business day following your procedure.  We will call around 7:15- 8:00 am to check on you and address any questions or concerns that you may have regarding the information given to you following your procedure. If we do not reach you, we will leave a message.     If any biopsies were taken you will be contacted by phone or by letter within the next 1-3 weeks.  Please call us  at [phone] if you have not heard about the biopsies in 3 weeks.   SIGNATURES/CONFIDENTIALITY: You and/or your care partner have signed paperwork which will be entered into your electronic medical record.  These signatures attest to the fact that that the information above on your After Visit Summary has been reviewed and is understood.  Full responsibility of the confidentiality of this discharge information lies with you and/or your care-partner.

## 2024-01-08 NOTE — Progress Notes (Signed)
 Pt's states no medical or surgical changes since previsit or office visit.

## 2024-01-08 NOTE — Op Note (Signed)
 [facility] Patient Name: Danny Carlson Procedure Date: 01/08/2024 2:59 PM MRN: [id number] Endoscopist: Victory L. Legrand , MD, [id number] Age: 58 Referring MD:  Date of Birth: 09/06/1965 Gender: Male Account #: [id number] Procedure:                Upper GI endoscopy Indications:              Cirrhosis, rule out esophageal varices Medicines:                Monitored Anesthesia Care Procedure:                Pre-Anesthesia Assessment:                           - Prior to the procedure, a History and Physical                            was performed, and patient medications and                            allergies were reviewed. The patient's tolerance of                            previous anesthesia was also reviewed. The risks                            and benefits of the procedure and the sedation                            options and risks were discussed with the patient.                            All questions were answered, and informed consent                            was obtained. Prior Anticoagulants: The patient has                            taken no anticoagulant or antiplatelet agents. ASA                            Grade Assessment: III - A patient with severe                            systemic disease. After reviewing the risks and                            benefits, the patient was deemed in satisfactory                            condition to undergo the procedure.                           After obtaining informed consent, the endoscope was  passed under direct vision. Throughout the                            procedure, the patient's blood pressure, pulse, and                            oxygen saturations were monitored continuously. The                            Olympus Scope [id number] was introduced through the                            mouth, and advanced to the second part of duodenum.                            The upper  GI endoscopy was accomplished without                            difficulty. The patient tolerated the procedure                            well. Scope In: Scope Out: Findings:                 The larynx was normal.                           3 columns of grade II varices were found in the                            middle third of the esophagus and in the lower                            third of the esophagus. They were small in size.                           Diffuse inflammation characterized by adherent                            blood and congestion (edema) was found in the                            gastric fundus and in the gastric body. Several                            biopsies were obtained on the greater curvature of                            the gastric body, on the lesser curvature of the                            gastric body, on the greater curvature of the  gastric antrum and on the lesser curvature of the                            gastric antrum with cold forceps for histology.                            (Biopsies from antrum and body in same pathology                            jar)                           The exam of the stomach was otherwise normal.                           The cardia and gastric fundus were normal on                            retroflexion. No varices seen in the stomach                           The examined duodenum was normal. Complications:            No immediate complications. Estimated Blood Loss:     Estimated blood loss was minimal. Impression:               - Normal larynx.                           - Grade II esophageal varices.                           - Gastritis. Does not have typical appearance of                            portal gastropathy, but still could be that. Also                            consider smoking and alcohol as possible culprits.                            Biopsies taken to rule  out H. pylori.                           - Normal examined duodenum.                           - Several biopsies were obtained on the greater                            curvature of the gastric body, on the lesser                            curvature of the gastric body, on the greater  curvature of the gastric antrum and on the lesser                            curvature of the gastric antrum. Recommendation:           - Patient has a contact number available for                            emergencies. The signs and symptoms of potential                            delayed complications were discussed with the                            patient. Return to normal activities tomorrow.                            Written discharge instructions were provided to the                            patient.                           - Resume previous diet.                           - Continue present medications.                           - Await pathology results.                           - See the other procedure note for documentation of                            additional recommendations.                           - Stop cigarettes and alcohol if have not already                            done so.                           - Alpha-fetoprotein lab draw after leaving                            endoscopy center today. (See today's H&P)                           This report will be forwarded to the patient's                            primary care provider with request that they see  this man and adjust his antihypertensives so he can                            be on carvedilol starting at 3.125 mg twice daily                            (probably in place of one of his other                            antihypertensives). Dose should be increased as                            tolerated to achieve resting heart rate in the 60s                             for primary prophylaxis of variceal bleeding. Raynette Arras L. Legrand, MD 01/08/2024 4:09:55 PM This report has been signed electronically.

## 2024-01-11 ENCOUNTER — Telehealth: Payer: Self-pay | Admitting: *Deleted

## 2024-01-11 LAB — AFP TUMOR MARKER: AFP-Tumor Marker: 12.7 ng/mL — ABNORMAL HIGH (ref <6.1)

## 2024-01-11 NOTE — Telephone Encounter (Signed)
  Follow up Call-     01/08/2024    2:53 PM  Call back number  Post procedure Call Back phone  # [phone]  Permission to leave phone message Yes     Patient questions:  Do you have a fever, pain , or abdominal swelling? No. Pain Score  0 *  Have you tolerated food without any problems? Yes.    Have you been able to return to your normal activities? Yes.    Do you have any questions about your discharge instructions: Diet   No. Medications  No. Follow up visit  No.  Do you have questions or concerns about your Care? No.  Actions: * If pain score is 4 or above: No action needed, pain <4.

## 2024-01-12 ENCOUNTER — Ambulatory Visit: Payer: Self-pay | Admitting: Gastroenterology

## 2024-01-12 DIAGNOSIS — R932 Abnormal findings on diagnostic imaging of liver and biliary tract: Secondary | ICD-10-CM

## 2024-01-12 DIAGNOSIS — K7460 Unspecified cirrhosis of liver: Secondary | ICD-10-CM

## 2024-01-12 DIAGNOSIS — B182 Chronic viral hepatitis C: Secondary | ICD-10-CM

## 2024-01-13 LAB — SURGICAL PATHOLOGY

## 2024-01-14 ENCOUNTER — Ambulatory Visit: Payer: Self-pay | Admitting: Gastroenterology

## 2024-01-28 ENCOUNTER — Other Ambulatory Visit: Payer: Self-pay

## 2024-01-29 ENCOUNTER — Other Ambulatory Visit: Payer: Self-pay

## 2024-02-08 ENCOUNTER — Ambulatory Visit: Payer: MEDICAID | Admitting: Family Medicine

## 2024-02-09 ENCOUNTER — Other Ambulatory Visit: Payer: Self-pay

## 2024-02-12 ENCOUNTER — Other Ambulatory Visit: Payer: Self-pay | Admitting: Pharmacist

## 2024-02-12 NOTE — Progress Notes (Signed)
 Specialty Pharmacy Ongoing Clinical Assessment Note  Danny Carlson is a 58 y.o. male who is being followed by the specialty pharmacy service for RxSp Hepatitis C   Patient's specialty medication(s) reviewed today: Sofosbuvir -Velpatasvir    Missed doses in the last 4 weeks: 0   Patient/Caregiver did not have any additional questions or concerns.   Therapeutic benefit summary: Patient is achieving benefit   Adverse events/side effects summary: No adverse events/side effects   Patient's therapy is appropriate to: Discontinue    Goals Addressed             This Visit's Progress    COMPLETED: Achieve virologic cure as evidenced by SVR       Patient is initiating therapy. Patient will be evaluated at upcoming provider appointment to assess progress      COMPLETED: Comply with lab assessments       Patient is on track. Patient will adhere to provider and/or lab appointments      COMPLETED: Maintain optimal adherence to therapy       Patient is initiating therapy. Patient will be evaluated at upcoming provider appointment to assess progress         Follow up: none required - therapy complete  [name] Specialty Pharmacist

## 2024-03-07 ENCOUNTER — Ambulatory Visit (HOSPITAL_COMMUNITY): Payer: MEDICAID | Attending: Gastroenterology

## 2024-03-29 ENCOUNTER — Telehealth: Payer: Self-pay | Admitting: Family Medicine

## 2024-03-29 NOTE — Telephone Encounter (Signed)
 Pt confirmed apopt

## 2024-03-30 ENCOUNTER — Other Ambulatory Visit: Payer: Self-pay

## 2024-03-30 ENCOUNTER — Ambulatory Visit: Payer: MEDICAID | Attending: Family Medicine | Admitting: Family Medicine

## 2024-03-30 ENCOUNTER — Encounter: Payer: Self-pay | Admitting: Family Medicine

## 2024-03-30 ENCOUNTER — Telehealth: Payer: Self-pay

## 2024-03-30 VITALS — BP 164/71 | HR 56 | Temp 98.1°F | Ht 64.0 in | Wt 125.6 lb

## 2024-03-30 DIAGNOSIS — K7460 Unspecified cirrhosis of liver: Secondary | ICD-10-CM | POA: Diagnosis not present

## 2024-03-30 DIAGNOSIS — I10 Essential (primary) hypertension: Secondary | ICD-10-CM | POA: Diagnosis not present

## 2024-03-30 MED ORDER — VALSARTAN-HYDROCHLOROTHIAZIDE 160-25 MG PO TABS
1.0000 | ORAL_TABLET | Freq: Every day | ORAL | 1 refills | Status: AC
  Filled 2024-03-30: qty 90, 90d supply, fill #0

## 2024-03-30 MED ORDER — AMLODIPINE BESYLATE 10 MG PO TABS
10.0000 mg | ORAL_TABLET | Freq: Every day | ORAL | 1 refills | Status: AC
  Filled 2024-03-30: qty 90, 90d supply, fill #0

## 2024-03-30 NOTE — Patient Instructions (Addendum)
 VISIT SUMMARY:  Danny Carlson, a 59 year old male with hypertension and cirrhosis, came in for a follow-up and medication management. He has a history of atrial fibrillation, COPD, nicotine  dependence, and colonic polyps. He also has eczema and foot pain, and he reports worsening vision.  YOUR PLAN:  -PRIMARY HYPERTENSION: Your blood pressure is elevated because you missed your medication doses. It was previously stable when you took your medication consistently. We have sent refill prescriptions for amlodipine  and valsartan -hydrochlorothiazide  to your pharmacy. Please take your medications consistently to assess their effectiveness. Hypertension means high blood pressure, which can lead to serious health problems if not managed properly.  -CIRRHOSIS OF LIVER: Your continued alcohol consumption may worsen your cirrhosis. We strongly advise you to reduce or quit alcohol consumption to prevent further damage. Cirrhosis is a condition where the liver is severely scarred and its function is impaired.  -NICOTINE  DEPENDENCE, CIGARETTES: You are currently smoking one pack every three days and using nicotine  patches but are not ready to quit. We encourage you to continue using nicotine  patches and consider future cessation. Nicotine  dependence is an addiction to tobacco products caused by the drug nicotine .   -GENERAL HEALTH MAINTENANCE: We discussed the importance of regular screenings and specialist follow-ups. You have been referred to a dermatologist for eczema management, a podiatrist for foot care and nail clipping, and an ophthalmologist for a vision evaluation.

## 2024-03-30 NOTE — Progress Notes (Signed)
 "  Subjective:  Patient ID: Danny Carlson, male    DOB: 10-12-1965  Age: 59 y.o. MRN: [id number]  CC: Medical Management of Chronic Issues (Medication refill. )     Discussed the use of AI scribe software for clinical note transcription with the patient, who gave verbal consent to proceed.  History of Present Illness Danny Carlson is a 59 year old male with hypertension, osteoarthritis, hepatitis C and cirrhosis who presents for follow-up and medication management.  He is currently on amlodipine  10 mg and valsartan /hydrochlorothiazide  for hypertension but has not taken his blood pressure medications today because he walked to the appointment. His blood pressure was previously well controlled in October but has been elevated at visits when he stated he rushed to the appointment.   He is under the care of GI for management of his cirrhosis and continues to consume alcohol.  He states he requests completion of an FL 2 form but provides no additional information.  He is unable to tell me if he will be placed in the group home or not but states he does not remember.  Past Medical History:  Diagnosis Date   Chronic hepatitis C (HCC)    Cirrhosis of liver (HCC)    EtOH dependence (HCC)    Hypertension     No past surgical history on file.  Family History  Problem Relation Age of Onset   Cancer Father    Prostate cancer Brother    Colon cancer Neg Hx    Rectal cancer Neg Hx    Stomach cancer Neg Hx     Social History   Socioeconomic History   Marital status: Single    Spouse name: Not on file   Number of children: Not on file   Years of education: Not on file   Highest education level: Not on file  Occupational History   Not on file  Tobacco Use   Smoking status: Every Day    Current packs/day: 0.50    Average packs/day: 0.5 packs/day for 20.0 years (10.0 ttl pk-yrs)    Types: Cigarettes   Smokeless tobacco: Never  Vaping Use   Vaping status: Never Used   Substance and Sexual Activity   Alcohol use: Yes    Comment: WEEKLY   Drug use: No   Sexual activity: Not Currently  Other Topics Concern   Not on file  Social History Narrative   Not on file   Social Drivers of Health   Tobacco Use: High Risk (01/08/2024)   Patient History    Smoking Tobacco Use: Every Day    Smokeless Tobacco Use: Never    Passive Exposure: Not on file  Financial Resource Strain: Not on file  Food Insecurity: Not on file  Transportation Needs: Not on file  Physical Activity: Not on file  Stress: Not on file  Social Connections: Not on file  Depression (PHQ2-9): Medium Risk (11/04/2023)   Depression (PHQ2-9)    PHQ-2 Score: 8  Alcohol Screen: Not on file  Housing: High Risk (09/09/2023)   Epic    Unable to Pay for Housing in the Last Year: Not on file    Number of Times Moved in the Last Year: Not on file    Homeless in the Last Year: Yes  Utilities: Not on file  Health Literacy: Not on file    Allergies[1]  Outpatient Medications Prior to Visit  Medication Sig Dispense Refill   cetirizine  (ZYRTEC ) 10 MG tablet Take 1 tablet (10  mg total) by mouth daily as needed for itching. 30 tablet 11   fluticasone  (FLONASE ) 50 MCG/ACT nasal spray Place 2 sprays into both nostrils daily. 16 g 6   meloxicam  (MOBIC ) 7.5 MG tablet Take 1 tablet (7.5 mg total) by mouth daily. 30 tablet 1   amLODipine  (NORVASC ) 10 MG tablet Take 1 tablet (10 mg total) by mouth daily. 90 tablet 2   valsartan -hydrochlorothiazide  (DIOVAN -HCT) 160-25 MG tablet Take 1 tablet by mouth daily. 90 tablet 3   Clindamycin -Benzoyl Per, Refr, gel Apply topically 2 (two) times daily. (Patient not taking: Reported on 03/30/2024) 45 g 0   doxepin  (SINEQUAN ) 150 MG capsule Take 1 capsule (150 mg total) by mouth at bedtime as needed. (Patient not taking: Reported on 03/30/2024) 60 capsule 2   hydrOXYzine  (ATARAX ) 10 MG tablet Take 1 tablet (10 mg total) by mouth 3 (three) times daily as needed for itching.  (Patient not taking: Reported on 03/30/2024) 60 tablet 2   nicotine  (NICODERM CQ ) 7 mg/24hr patch Place 1 patch (7 mg total) onto the skin daily. (Patient not taking: Reported on 03/30/2024) 28 patch 3   Sofosbuvir -Velpatasvir  (EPCLUSA ) 400-100 MG TABS Take 1 tablet by mouth daily. (Patient not taking: Reported on 03/30/2024) 28 tablet 2   triamcinolone  cream (KENALOG ) 0.1 % Apply 1 Application topically 2 (two) times daily. (Patient not taking: Reported on 03/30/2024) 80 g 0   No facility-administered medications prior to visit.     ROS Review of Systems  Constitutional:  Negative for activity change and appetite change.  HENT:  Negative for sinus pressure and sore throat.   Respiratory:  Negative for chest tightness, shortness of breath and wheezing.   Cardiovascular:  Negative for chest pain and palpitations.  Gastrointestinal:  Negative for abdominal distention, abdominal pain and constipation.  Genitourinary: Negative.   Musculoskeletal: Negative.   Psychiatric/Behavioral:  Negative for behavioral problems and dysphoric mood.     Objective:  BP (!) 164/71   Pulse (!) 56   Temp 98.1 F (36.7 C) (Oral)   Ht 5' 4 (1.626 m)   Wt 125 lb 9.6 oz (57 kg)   SpO2 100%   BMI 21.56 kg/m      03/30/2024    2:36 PM 03/30/2024    2:06 PM 01/08/2024    4:26 PM  BP/Weight  Systolic BP 164 161 112  Diastolic BP 71 76 68  Wt. (Lbs)  125.6   BMI  21.56 kg/m2       Physical Exam Constitutional:      Appearance: He is well-developed.  Cardiovascular:     Rate and Rhythm: Bradycardia present.     Heart sounds: Normal heart sounds. No murmur heard. Pulmonary:     Effort: Pulmonary effort is normal.     Breath sounds: Normal breath sounds. No wheezing or rales.  Chest:     Chest wall: No tenderness.  Abdominal:     General: Bowel sounds are normal. There is no distension.     Palpations: Abdomen is soft. There is no mass.     Tenderness: There is no abdominal tenderness.   Musculoskeletal:        General: Normal range of motion.     Right lower leg: No edema.     Left lower leg: No edema.  Neurological:     Mental Status: He is alert and oriented to person, place, and time.  Psychiatric:        Mood and Affect: Mood normal.  Latest Ref Rng & Units 11/04/2023    2:23 PM 11/04/2023    9:21 AM 05/28/2023    9:33 AM  CMP  Glucose 70 - 99 mg/dL 91  93    BUN 6 - 23 mg/dL 18  18    Creatinine 9.59 - 1.50 mg/dL 9.14  9.06    Sodium 864 - 145 mEq/L 135  138    Potassium 3.5 - 5.1 mEq/L 4.2  4.4    Chloride 96 - 112 mEq/L 101  101    CO2 19 - 32 mEq/L 24  21    Calcium 8.4 - 10.5 mg/dL 9.5  89.9    Total Protein 6.0 - 8.3 g/dL 9.0  8.6  8.1   Total Bilirubin 0.2 - 1.2 mg/dL 0.6  0.5  0.7   Alkaline Phos 39 - 117 U/L 153  194    AST 0 - 37 U/L 29  32  82   ALT 0 - 53 U/L 21  21  60    61     Lipid Panel  No results found for: CHOL, TRIG, HDL, CHOLHDL, VLDL, LDLCALC, LDLDIRECT  CBC    Component Value Date/Time   WBC 8.7 11/04/2023 1423   RBC 3.81 (L) 11/04/2023 1423   HGB 12.9 (L) 11/04/2023 1423   HGB 10.4 (L) 05/07/2023 1037   HCT 37.7 (L) 11/04/2023 1423   HCT 28.8 (L) 05/07/2023 1037   PLT 166.0 11/04/2023 1423   PLT 182 05/07/2023 1037   MCV 99.0 11/04/2023 1423   MCV 96 05/07/2023 1037   MCH 34.7 (H) 05/07/2023 1037   MCH 35.8 (H) 01/23/2022 1529   MCHC 34.1 11/04/2023 1423   RDW 12.5 11/04/2023 1423   RDW 10.7 (L) 05/07/2023 1037   LYMPHSABS 2.8 11/04/2023 1423   LYMPHSABS 2.5 05/07/2023 1037   MONOABS 1.0 11/04/2023 1423   EOSABS 0.3 11/04/2023 1423   EOSABS 0.2 05/07/2023 1037   BASOSABS 0.1 11/04/2023 1423   BASOSABS 0.1 05/07/2023 1037    Lab Results  Component Value Date   HGBA1C 5.0 06/05/2022       Assessment & Plan Primary hypertension Blood pressure elevated due to missed medication dose. Previous readings stable with consistent medication use. - Sent refill prescriptions for amlodipine   and valsartan -hydrochlorothiazide  to pharmacy. - Advised consistent medication adherence to assess effectiveness. -Will order basic metabolic panel. -Counseled on blood pressure goal of less than 130/80, low-sodium, DASH diet, medication compliance, 150 minutes of moderate intensity exercise per week. Discussed medication compliance, adverse effects.   Cirrhosis of liver Continued alcohol consumption may worsen cirrhosis. - Advised to reduce or quit alcohol consumption to prevent worsening. - Continue to follow-up with GI        Meds ordered this encounter  Medications   amLODipine  (NORVASC ) 10 MG tablet    Sig: Take 1 tablet (10 mg total) by mouth daily.    Dispense:  90 tablet    Refill:  1   valsartan -hydrochlorothiazide  (DIOVAN -HCT) 160-25 MG tablet    Sig: Take 1 tablet by mouth daily.    Dispense:  90 tablet    Refill:  1    Follow-up: Return in about 3 months (around 06/28/2024) for Chronic medical conditions.       Corrina Sabin, MD, FAAFP. [facility] [place], [place] [phone]   03/30/2024, 5:32 PM     [1] No Known Allergies  "

## 2024-03-30 NOTE — Telephone Encounter (Signed)
 I met with the patient when he was in the clinic today.  He explained that he needs an FL2 but was not sure why.  When I asked him who needed it, he said his caseworker, Psychologist, Educational at [facility].  We called Shay: [phone] to verify why an FL2 is needed.   She explained that the FL2 is needed to update his TCL application.  She said the signed FL2 can be faxed to her: [phone].    The patient said he is no longer at the shelter and is staying with his cousin but knows he will have to move out of there at some point.  I asked him about his income and if he receives disability. He said not at this time but they are working with him to submit an application. I asked him who is helping him and he did not know. I asked him if we can call Shay back about this and he said no, he will call her tomorrow.  I asked him to let me know if he is not preparing to submit a disability application to [facility] and I will be able to refer him for assistance with filing that application

## 2024-03-31 ENCOUNTER — Ambulatory Visit: Payer: Self-pay | Admitting: Family Medicine

## 2024-03-31 LAB — BASIC METABOLIC PANEL WITH GFR
BUN/Creatinine Ratio: 13 (ref 10–24)
BUN: 10 mg/dL (ref 8–27)
CO2: 22 mmol/L (ref 20–29)
Calcium: 10 mg/dL (ref 8.6–10.2)
Chloride: 102 mmol/L (ref 96–106)
Creatinine, Ser: 0.78 mg/dL (ref 0.76–1.27)
Glucose: 99 mg/dL (ref 70–99)
Potassium: 4.7 mmol/L (ref 3.5–5.2)
Sodium: 141 mmol/L (ref 134–144)
eGFR: 102 mL/min/1.73

## 2024-04-05 NOTE — Telephone Encounter (Signed)
 Signed FL2 faxed to [facility] as requested.

## 2024-05-09 ENCOUNTER — Ambulatory Visit: Payer: MEDICAID | Admitting: Family Medicine

## 2024-06-28 ENCOUNTER — Ambulatory Visit: Payer: Self-pay | Admitting: Family Medicine
